# Patient Record
Sex: Male | Born: 1945 | Race: White | Hispanic: No | Marital: Married | Smoking: Never smoker
Health system: Southern US, Community
[De-identification: ages and names within clinical notes are randomized; demographics above are authoritative.]

## PROBLEM LIST (undated history)

## (undated) DIAGNOSIS — I10 Essential (primary) hypertension: Secondary | ICD-10-CM

## (undated) DIAGNOSIS — I2510 Atherosclerotic heart disease of native coronary artery without angina pectoris: Secondary | ICD-10-CM

## (undated) DIAGNOSIS — E7800 Pure hypercholesterolemia, unspecified: Secondary | ICD-10-CM

## (undated) DIAGNOSIS — J45909 Unspecified asthma, uncomplicated: Secondary | ICD-10-CM

## (undated) DIAGNOSIS — I480 Paroxysmal atrial fibrillation: Secondary | ICD-10-CM

## (undated) DIAGNOSIS — M419 Scoliosis, unspecified: Secondary | ICD-10-CM

## (undated) DIAGNOSIS — R7303 Prediabetes: Secondary | ICD-10-CM

## (undated) HISTORY — PX: KNEE SURGERY: SHX244

## (undated) HISTORY — PX: BACK SURGERY: SHX140

---

## 2003-04-20 ENCOUNTER — Ambulatory Visit (HOSPITAL_BASED_OUTPATIENT_CLINIC_OR_DEPARTMENT_OTHER): Admission: RE | Admit: 2003-04-20 | Discharge: 2003-04-20 | Payer: Self-pay | Admitting: Orthopedic Surgery

## 2005-01-23 ENCOUNTER — Inpatient Hospital Stay (HOSPITAL_COMMUNITY): Admission: RE | Admit: 2005-01-23 | Discharge: 2005-01-27 | Payer: Self-pay | Admitting: Orthopedic Surgery

## 2008-12-20 ENCOUNTER — Ambulatory Visit: Payer: Self-pay | Admitting: Diagnostic Radiology

## 2008-12-20 ENCOUNTER — Emergency Department (HOSPITAL_BASED_OUTPATIENT_CLINIC_OR_DEPARTMENT_OTHER): Admission: EM | Admit: 2008-12-20 | Discharge: 2008-12-20 | Payer: Self-pay | Admitting: Emergency Medicine

## 2010-12-10 LAB — COMPREHENSIVE METABOLIC PANEL
Albumin: 3.9 g/dL (ref 3.5–5.2)
BUN: 15 mg/dL (ref 6–23)
CO2: 30 mEq/L (ref 19–32)
Chloride: 105 mEq/L (ref 96–112)
Creatinine, Ser: 1 mg/dL (ref 0.4–1.5)
Glucose, Bld: 110 mg/dL — ABNORMAL HIGH (ref 70–99)
Potassium: 3.8 mEq/L (ref 3.5–5.1)
Sodium: 142 mEq/L (ref 135–145)
Total Protein: 7.4 g/dL (ref 6.0–8.3)

## 2010-12-10 LAB — URINALYSIS, ROUTINE W REFLEX MICROSCOPIC
Glucose, UA: NEGATIVE mg/dL
Hgb urine dipstick: NEGATIVE
Ketones, ur: 15 mg/dL — AB
Nitrite: NEGATIVE
Protein, ur: NEGATIVE mg/dL
pH: 7 (ref 5.0–8.0)

## 2010-12-10 LAB — DIFFERENTIAL
Eosinophils Absolute: 0.1 10*3/uL (ref 0.0–0.7)
Lymphocytes Relative: 5 % — ABNORMAL LOW (ref 12–46)
Monocytes Absolute: 0.6 10*3/uL (ref 0.1–1.0)
Monocytes Relative: 6 % (ref 3–12)
Neutro Abs: 9.1 10*3/uL — ABNORMAL HIGH (ref 1.7–7.7)
Neutrophils Relative %: 87 % — ABNORMAL HIGH (ref 43–77)

## 2010-12-10 LAB — CBC
MCHC: 34.3 g/dL (ref 30.0–36.0)
MCV: 92 fL (ref 78.0–100.0)

## 2011-01-16 NOTE — H&P (Signed)
NAME:  Mark Pham, Mark Pham NO.:  [id number]   MEDICAL RECORD NO.:  [id number]          PATIENT TYPE:  INP   LOCATION:  NA                           FACILITY:  [facility]   PHYSICIAN:  Dyke Brackett, M.D.    DATE OF BIRTH:  12/18/1945   DATE OF ADMISSION:  01/23/2005  DATE OF DISCHARGE:                                HISTORY & PHYSICAL   CHIEF COMPLAINT:  Left knee pain.   HISTORY OF PRESENT ILLNESS:  Mark Pham is a 65 year old gentleman who has  been having problems with his left knee for the last ten years.  He has been  having some pain and difficulty with range of motion and his knee giving  out.  He describes the pain as a sharp stabbing type pain along the medial  anterior and posterior aspect of the knee, just a constant sharp type pain.  He does note some swelling.  He does have some night pain.  He denies any  mechanical symptoms.  The patient has failed Synvisc therapy due to  significant reaction.  X-rays reveal near bone on bone left knee.   ALLERGIES:  ACE inhibitors causing exacerbation of asthma.   CURRENT MEDICATIONS:  Verapamil HCL 240 mg p.o. b.i.d., Probenecid 500 mg  p.o. daily, Naproxen 500 mg p.o. b.i.d. p.r.n., Colchicine 0.5 mg p.o.  p.r.n., AeroBid inhaler 250 mcg 2 puffs b.i.d.   PAST MEDICAL HISTORY:  1. Hypertension.  2. Asthma.  3. Gout.     PAST SURGICAL HISTORY:  1. Thoracoscopic exploratory for sarcoidosis.  2. Venous stripping, left leg.  3. Gallbladder removal.  4. Rotator cuff repair.  5. Knee arthroscopy x 2.  6. The patient denies any complications of asthma, surgical procedures, or      any infections.     SOCIAL HISTORY:  The patient is a healthy appearing, slightly heavy set  white male, no history of smoking or alcohol use, he is married, has several  children.  He lives in a one story house.  He is a retired Civil engineer, contracting.  His family physician is Dr. Synetta Fail and Marlyne Beards, P.A.-C.,  phone number  [phone].   FAMILY HISTORY:  Mother is deceased from a head injury.  Father deceased  from heart enlargement and cardiac disease.  One brother deceased from  complications of ETOH and cirrhosis.  One brother alive with a history of  diabetes and AIDS.  One sister alive with diabetes.   REVIEW OF SYMPTOMS:  Positive for recent post nasal drip with coughing.  The  patient denies any fevers, chills, congestion, difficulties with breathing,  or any other complaints.  He does have occasional shortness of breath with  exertion.  He relates to his weight and deconditioning.  He denies any  cardiac related symptoms.  The patient has been recently out of the country  in [place] returning in March of this year.  No particular medical  illnesses or problems while out of the country.  Otherwise, all other review  of systems categories are negative or noncontributory at this time.  PHYSICAL EXAMINATION:  VITAL SIGNS:  Height 5 feet 11 inches, weight 260 pounds, blood pressure  158/82, pulse 80 and regular, respirations 12, the patient is afebrile.  GENERAL:  This is a slightly centrally obese white male ambulates with a  slight left sided limp and he easily gets on and off the exam table without  any active distress.  HEENT:  Head was normocephalic.  Pupils equal, round, and reactive.  Extraocular movements intact.  Sclerae nonicteric.  External ears were  without deformities.  Gross hearing is intact.  Oral buccal mucosa was pink  and moist.  NECK:  Supple, no palpable lymphadenopathy, thyroid region was nontender.  He had good range of motion of the cervical spine without difficulty or  tenderness.  CHEST:  Lung sounds were clear and equal bilaterally, no wheezes, rales, or  rhonchi.  HEART:  Regular rate and rhythm, no murmurs, gallops, and rubs.  ABDOMEN:  Round, soft, nontender, bowel sounds normal active, CVA region was  nontender.  EXTREMITIES:  Upper extremities were symmetrically  sized and shaped.  He had  good range of motion of his shoulders, elbows, and wrist.  Motor strength  was 5/5.  Lower extremities:  Right and left hip had full extension, flexion  up to 120 degrees with 20 degrees internal and external rotation without any  difficulty.  The left knee had no signs of erythema or ecchymosis.  There  was some diffuse soft tissue swelling.  He had no effusion.  He was tender  along the medial and lateral joint line.  He had crepitus of the patella.  He had full extension, flexion to 100 degrees, and maybe 5 degrees of valgus  varus laxity.  The calves were soft and nontender.  The right knee was  without any signs of erythema or ecchymosis, no effusion, no diffuse  tenderness.  He had some slight crepitus on the patella.  He had full  extension, flexion back to 120 degrees, no instability, the calf was soft  and nontender.  The ankles were symmetrical with good dorsiplantar flexion.  PERIPHERAL VASCULATURE:  Carotid pulses were 2+, no bruits, radial pulses  were 2+, dorsalis pedes pulses were 2+.  He had no lower extremity edema.  He had multiple varicosities about the lower extremities.  NEUROLOGICAL:  The patient was conscious, alert and appropriate, easy  conversation with examiner.  Cranial nerves 2-12 were grossly intact.  He  had no gross neurologic defects noted.  BREASTS/RECTAL/GU:  Exams were deferred at this time.   IMPRESSION:  1. 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  2. Hypertension.  3. Asthma.  4. Gout.     PLAN:  The patient has been to his primary care physician for preoperative  evaluation with no complications noted.  The patient will undergo all other  routine labs and tests prior to having his left total knee arthroplasty by  Dr. Madelon Lips at [facility] on May 26.      RWK/MEDQ  D:  01/13/2005  T:  01/13/2005  Job:  [id number]

## 2011-01-16 NOTE — Discharge Summary (Signed)
NAME:  Mark Pham, Mark Pham NO.:  [id number]   MEDICAL RECORD NO.:  [id number]          PATIENT TYPE:  INP   LOCATION:  5014                         FACILITY:  [facility]   PHYSICIAN:  Dyke Brackett, M.D.    DATE OF BIRTH:  Feb 12, 1946   DATE OF ADMISSION:  01/23/2005  DATE OF DISCHARGE:  01/27/2005                                 DISCHARGE SUMMARY   ADMISSION DIAGNOSES:  1.  End-stage osteoarthritis, left knee.  2.  Hypertension.  3.  Asthma.  4.  Gout.   DISCHARGE DIAGNOSES:  1.  Status post left total knee arthroplasty.  2.  Acute blood loss anemia without symptoms.   HISTORY OF PRESENT ILLNESS:  Mark Pham is a 65 year old gentleman who has  had problems with his left knee for the past 10 years.  He has been having  some pain and difficulty with range of motion of the knee.  Mechanical  symptoms positive for giving way.  He describes the pain as a sharp,  stabbing type pain along the medial and anterior and posterior aspects of  the knee.  Positive weight pain.  He has failed conservative treatment which  included Synvisc injections.  He did have significant reaction to the  Synvisc.  X-rays of the left knee showed near bone on bone.   ALLERGIES:  ACE INHIBITORS cause exacerbation of asthma.   CURRENT MEDICATIONS:  1.  Verapamil/HCL 240 mg p.o. b.i.d.  2.  Naproxen 500 mg p.o. b.i.d. PRN.  3.  Colchicine 0.5 mg p.o. PRN.  4.  AeroBid inhaler 250 mcg two puffs b.i.d.  5.  Probenecid 500 mg p.o. daily.   SURGICAL PROCEDURE:  The patient was taken to the operating room on Jan 23, 2005 by Dr. Dyke Brackett, assisted by Rexene Edison, P.A., placed under general  anesthesia and a left total knee arthroplasty was performed.  The following  components were used:  An LCS large femur and patella, size 5 tibia with  12.5 mm bearing, all cemented.  The patient tolerated the procedure well and  was returned to the recovery room in good and stable condition.   CONSULTATIONS:   The following consults were obtained during the patient's  hospitalization:  1.  Physical therapy.  2.  Occupational therapy.  3.  Case management.   HOSPITAL COURSE:  On postoperative day #1 the patient developed nausea and  vomiting and therefore was changed to morphine, Dilaudid PCA.  Otherwise  patient was afebrile with vital signs stable.  H/H were 11.6 and 35.1.  White count was 14,400.  On postoperative day #2 the patient's nausea  improved.  The patient was placed on Urecholine for urinary retention.  PCA  was discontinued and Darvocet was started for pain.  There was some  difficulty removing the Hemovac from the knee, however, after the patient  worked with physical therapy the drain was pulled easily by Dr. Carola Frost.  X-  rays were performed and were to be reviewed by Dr. Madelon Lips on Monday.  On  postoperative day #3 the patient was afebrile and vital signs  were stable.  The patient underwent physical therapy and is progressing well.  On  postoperative day #4 the films were reviewed by Dr. Madelon Lips and __________  were, in fact, part of the tibial bearing to show orientation.  The patient  was afebrile with vital signs stable.  The wound was benign and therefore  the patient was discharged home in good condition.   LABORATORY DATA:  Routine labs on admission, CBC with all values within  normal limits.  Coag's with all values within normal limits.  Routine  chemistries with all values within normal limits.  Hepatic enzymes on  admission AST 41, ALT 43, ALP 89, total bilirubin 0.9.  Urinalysis on  admission was negative.   CLINICAL DATA:  X-rays, 2 views of the left knee performed on Jan 23, 2005  postoperative showed satisfactory total knee replacement without any  complications.  Two views of the left knee performed on Jan 26, 2005 showed  questionable residual stable marker piece of drain along the ____________ on  the lateral projection.  Abdominal x-ray performed on Jan 25, 2005 showed  mild gaseous distention of small and large bowel.  Stool was present  throughout the colon, no obstruction.  Findings may represent mild diffuse  ileus.   Electrocardiogram on Jan 19, 2005 showed sinus bradycardia with heart rate  55 beats per minute.  P-R interval 170 milliseconds. PRT axis 15, negative  ___________.   DISCHARGE INSTRUCTIONS:   DISCHARGE MEDICATIONS:  1.  Percocet 5/325 one to two every 4 to 6 hours as needed for pain.  2.  Lovenox 30 mg one shot q.12h. until ___________.  3.  Verapamil 240 mg one tablet b.i.d.  4.  Probenecid 500 mg daily.  5.  Colchicine 0.6 mg as needed for gout.  6.  AeroBid 2 puffs daily.  7.  Albuterol inhaler as needed.  8.  Mucinex as needed.  9.  Do not take Naproxen while on Lovenox.  10. Stool softener to be bought at the pharmacy.   DIET:  No restrictions.   ACTIVITY:  Weight-bearing as tolerated.   WOUND CARE:  The patient is to keep the wound clean and dry and change the  dressing daily.   FOLLOW UP:  Patient needs to follow up with Dr. Madelon Lips in the office on  Thursday following discharge.  Patient is to call the office at [phone].   SPECIAL INSTRUCTIONS:  Call with any increased pain, redness, drainage or  temperature greater than 101.  CPM 0 to 90% degrees 8 hours a day.   CONDITION ON DISCHARGE:  Patient was discharged home in good and stable  condition.      Richardean Canal, Arnetha Courser, M.D.  Electronically Signed    GC/MEDQ  D:  04/10/2005  T:  04/11/2005  Job:  [id number]

## 2011-01-16 NOTE — Op Note (Signed)
   NAME:  Mark, Pham NO.:  [id number]   MEDICAL RECORD NO.:  [id number]                   PATIENT TYPE:  AMB   LOCATION:  DSC                                  FACILITY:  [facility]   PHYSICIAN:  Thera Flake., M.D.             DATE OF BIRTH:  [birth year]   DATE OF PROCEDURE:  04/20/2003  DATE OF DISCHARGE:                                 OPERATIVE REPORT   INDICATIONS:  This is a 65 year old with previous arthroscopic surgery  several years ago, recurrent symptoms in the posteromedial joint line  consistent with degenerative change and meniscus tearing on MRI, thought to  be amenable to outpatient arthroscopy.   PREOPERATIVE DIAGNOSES:  1. Complex tear, posterior horn, medial meniscus.  2. 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  3. Grade 3 chondromalacia of femur   POSTOPERATIVE DIAGNOSES:  1. Complex tear, posterior horn, medial meniscus.  2. 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  3. Grade 3 chondromalacia of femur.   OPERATIONS:  1. Partial medial meniscectomy.  2. Microfracture of tibial plateau.   SURGEON:  Dyke Brackett, M.D.   ANESTHESIA:  General.   DESCRIPTION OF PROCEDURE:  Microscope through two portals, inferomedial and  inferolateral.  Patellofemoral joint and lateral compartment were  essentially normal.  The ACL and PCL normal.  Medial meniscus showed a  complex tear of the posterior horn of the meniscus requiring resection of  the posterior horn, probably 30% of the meniscus substance.  Underlying this  and in front of it and on each side, about 30% of the plateau had  unfortunately grade 4 changes of chondromalacia which were treated with a  microfracture technique with probably 15 holes being placed in the bone into  the subchondral bone.  The patient was drained free of fluids.  Portals  closed with nylon after injection of  Marcaine, morphine, and in addition 40  mg of  Depo-Medrol.   He was taken to the recovery room in stable condition.                                               Thera Flake., M.D.    WDC/MEDQ  D:  04/20/2003  T:  04/21/2003  Job:  [id number]

## 2011-01-16 NOTE — Op Note (Signed)
NAME:  Mark Pham, Mark Pham NO.:  [id number]   MEDICAL RECORD NO.:  [id number]          PATIENT TYPE:  INP   LOCATION:  2550                         FACILITY:  [facility]   PHYSICIAN:  Thera Flake., M.D.DATE OF BIRTH:  April 26, 1946   DATE OF PROCEDURE:  01/23/2005  DATE OF DISCHARGE:                                 OPERATIVE REPORT   PREOPERATIVE DIAGNOSIS:  Osteoarthritis of left knee with varus deformity.   POSTOPERATIVE DIAGNOSIS:  Osteoarthritis of left knee with varus deformity.   OPERATION:  Left total knee replacement (LCS large femur and patella, size 5  tibia with 12.5 mm bearing, cemented).   SURGEON:  Dyke Brackett, M.D.   ASSISTANTChestine Spore, P.A.   TOURNIQUET TIME:  1 hour 45 minutes.   DESCRIPTION OF PROCEDURE:  After preoperative femoral nerve block and  general anesthesia, the leg was exsanguinated and tourniquet inflated to 375  mmHg.  An incision medial parapatellar approach to the knee was made.  Moderate varus deformity was noted.  The tibia was cut about 2-3 mm below  the most diseased medial compartment, the most noticeable disease was  medially.  After the tibial cut, the anterior posterior femoral cut was made  and the flexion gap measured at 12.5 mm followed by distal 4 degree valgus  cut and the chamfer cut with keyholes for the prosthesis.  Flexion gap equal  to extension gap at 12.5 mm followed by distal 4 degree valgus cut and the  chamfer cuts with keyholes for the prosthesis.  Flexion gap equal to  extension gap at 12.5 mm.  Moderate stripping of the medial side of the knee  due to the varus deformity, it was tight particularly posteromedially, but  the ligamentous balance was deemed to be excellent at the conclusion of the  case.  Size 5 tibia was used with the keyhole being cut for the tibia.  There were no posterior osteophytes on the femur noted.  The patella was cut  leaving about 14 mm native patella, three peg patella.  Trial  reduction was  carried out with all components, fit deemed to be excellent, good stability.  It was thought with the 10 mm bearing, there was too much extension,  slightly too much drawer, and flexion, but the 12.5 mm bearing was excellent  in all planes tested.  Trial components were removed, the bony surfaces were  irrigated, final components were inserted, tibia, followed by femur and  patella.  The cement was allowed to harden, the tourniquet was released,  there was mild venous ooze from the incision.  The tourniquet was removed  from the leg, there was no excessive bleeding indicating significant injury  but thrombin spray was used due to some soft tissue ooze.  A Hemovac drain  was placed exiting superolaterally in the lateral gutter.  Closure was  effected with #1 Ethibond, 2-0 Vicryl, skin clips.  The Hemovac drain was  clamped.  He was taken to the recovery room in stable condition.    WDC/MEDQ  D:  01/23/2005  T:  01/23/2005  Job:  [id number] 

## 2013-01-02 ENCOUNTER — Emergency Department (HOSPITAL_BASED_OUTPATIENT_CLINIC_OR_DEPARTMENT_OTHER)
Admission: EM | Admit: 2013-01-02 | Discharge: 2013-01-02 | Disposition: A | Discharge status: Home / Self Care | Payer: Medicare Other | Attending: Emergency Medicine | Admitting: Emergency Medicine

## 2013-01-02 ENCOUNTER — Encounter (HOSPITAL_BASED_OUTPATIENT_CLINIC_OR_DEPARTMENT_OTHER): Payer: Self-pay | Admitting: *Deleted

## 2013-01-02 DIAGNOSIS — Y9389 Activity, other specified: Secondary | ICD-10-CM | POA: Insufficient documentation

## 2013-01-02 DIAGNOSIS — Z79899 Other long term (current) drug therapy: Secondary | ICD-10-CM | POA: Insufficient documentation

## 2013-01-02 DIAGNOSIS — I10 Essential (primary) hypertension: Secondary | ICD-10-CM | POA: Insufficient documentation

## 2013-01-02 DIAGNOSIS — Y929 Unspecified place or not applicable: Secondary | ICD-10-CM | POA: Insufficient documentation

## 2013-01-02 DIAGNOSIS — J45909 Unspecified asthma, uncomplicated: Secondary | ICD-10-CM | POA: Insufficient documentation

## 2013-01-02 DIAGNOSIS — S0180XA Unspecified open wound of other part of head, initial encounter: Secondary | ICD-10-CM | POA: Insufficient documentation

## 2013-01-02 DIAGNOSIS — S0993XA Unspecified injury of face, initial encounter: Secondary | ICD-10-CM | POA: Insufficient documentation

## 2013-01-02 DIAGNOSIS — E7800 Pure hypercholesterolemia, unspecified: Secondary | ICD-10-CM | POA: Insufficient documentation

## 2013-01-02 DIAGNOSIS — W208XXA Other cause of strike by thrown, projected or falling object, initial encounter: Secondary | ICD-10-CM | POA: Insufficient documentation

## 2013-01-02 DIAGNOSIS — S0181XA Laceration without foreign body of other part of head, initial encounter: Secondary | ICD-10-CM

## 2013-01-02 HISTORY — DX: Pure hypercholesterolemia, unspecified: E78.00

## 2013-01-02 HISTORY — DX: Essential (primary) hypertension: I10

## 2013-01-02 HISTORY — DX: Unspecified asthma, uncomplicated: J45.909

## 2013-01-02 NOTE — ED Notes (Signed)
Placing luggage in the attic and it fell onto his body hitting him in the face. Laceration to his right eyebrow. Bleeding controlled.

## 2013-01-02 NOTE — ED Provider Notes (Signed)
History     CSN: [id number]  Arrival date & time 01/02/13  2133   None     Chief Complaint  Patient presents with  . Facial Laceration    (Consider location/radiation/quality/duration/timing/severity/associated sxs/prior treatment) Patient is a 67 y.o. male presenting with facial injury. The history is provided by the patient. No language interpreter was used.  Facial Injury  The incident occurred just prior to arrival. The incident occurred at home. The injury mechanism was a direct blow. There is an injury to the face. The pain is moderate. It is unlikely that a foreign body is present. There have been prior injuries to these areas. His tetanus status is UTD. There were no sick contacts.  Pt reports a suitcase fell out of attic and hit him in the face  Past Medical History  Diagnosis Date  . Hypertension   . High cholesterol   . Asthma     History reviewed. No pertinent past surgical history.  No family history on file.  History  Substance Use Topics  . Smoking status: Never Smoker   . Smokeless tobacco: Not on file  . Alcohol Use: No      Review of Systems  All other systems reviewed and are negative.    Allergies  Ace inhibitors  Home Medications   Current Outpatient Rx  Name  Route  Sig  Dispense  Refill  . ALBUTEROL IN   Inhalation   Inhale into the lungs.         . Celecoxib (CELEBREX PO)   Oral   Take by mouth.         . Colesevelam HCl (WELCHOL PO)   Oral   Take by mouth.         Marland Kitchen HYDROCHLOROTHIAZIDE PO   Oral   Take by mouth.         . Montelukast Sodium (SINGULAIR PO)   Oral   Take by mouth.         . VERAPAMIL HCL PO   Oral   Take by mouth.           BP 171/95  Pulse 72  Temp(Src) 98 F (36.7 C) (Oral)  Resp 20  Wt 247 lb (112.038 kg)  SpO2 98%  Physical Exam  Nursing note and vitals reviewed. Constitutional: He is oriented to person, place, and time. He appears well-developed and well-nourished.  HENT:   Head: Normocephalic.  Right Ear: External ear normal.  Left Ear: External ear normal.  1cm laceration right eyebrow  Eyes: Conjunctivae and EOM are normal. Pupils are equal, round, and reactive to light.  Musculoskeletal: Normal range of motion.  Neurological: He is alert and oriented to person, place, and time. He has normal reflexes.  Skin: Skin is warm.  Psychiatric: He has a normal mood and affect.    ED Course  LACERATION REPAIR Date/Time: 01/02/2013 11:34 PM Performed by: Cheron Schaumann K Authorized by: Cheron Schaumann K Risks and benefits: risks, benefits and alternatives were discussed Consent given by: patient Required items: required blood products, implants, devices, and special equipment available Patient identity confirmed: verbally with patient Time out: Immediately prior to procedure a "time out" was called to verify the correct patient, procedure, equipment, support staff and site/side marked as required. Body area: head/neck Laceration length: 1 cm Foreign bodies: no foreign bodies Tendon involvement: none Nerve involvement: none Anesthesia: local infiltration Local anesthetic: lidocaine 2% with epinephrine Preparation: Patient was prepped and draped in the usual sterile fashion. Irrigation  solution: saline Debridement: none Skin closure: 6-0 Prolene Subcutaneous closure: 5-0 Vicryl Number of sutures: 3 Technique: simple Approximation: close Approximation difficulty: simple Patient tolerance: Patient tolerated the procedure well with no immediate complications.   (including critical care time)  Labs Reviewed - No data to display No results found.   1. Laceration of face without complication, initial encounter       MDM  Pt counseled on absorbable sutures.  (Pt is leaving for [place] and requested absorbable sutures.        Lonia Skinner [place], PA-C 01/02/13 2336

## 2013-01-02 NOTE — ED Notes (Addendum)
Pt reports suit case fell and struck him over R eye at eyebrow hair line bleeding controlled tetanus up to date

## 2013-01-04 NOTE — ED Provider Notes (Signed)
Medical screening examination/treatment/procedure(s) were performed by non-physician practitioner and as supervising physician I was immediately available for consultation/collaboration.   Brizeida Mcmurry W. Tonny Isensee, MD 01/04/13 0734 

## 2016-08-22 ENCOUNTER — Emergency Department (HOSPITAL_BASED_OUTPATIENT_CLINIC_OR_DEPARTMENT_OTHER)
Admission: EM | Admit: 2016-08-22 | Discharge: 2016-08-22 | Disposition: A | Discharge status: Home / Self Care | Payer: Medicare Other | Attending: Emergency Medicine | Admitting: Emergency Medicine

## 2016-08-22 ENCOUNTER — Encounter (HOSPITAL_BASED_OUTPATIENT_CLINIC_OR_DEPARTMENT_OTHER): Payer: Self-pay | Admitting: Emergency Medicine

## 2016-08-22 DIAGNOSIS — I10 Essential (primary) hypertension: Secondary | ICD-10-CM | POA: Diagnosis not present

## 2016-08-22 DIAGNOSIS — Z79899 Other long term (current) drug therapy: Secondary | ICD-10-CM | POA: Insufficient documentation

## 2016-08-22 DIAGNOSIS — M545 Low back pain, unspecified: Secondary | ICD-10-CM

## 2016-08-22 DIAGNOSIS — J45909 Unspecified asthma, uncomplicated: Secondary | ICD-10-CM | POA: Diagnosis not present

## 2016-08-22 MED ORDER — BUPIVACAINE HCL (PF) 0.5 % IJ SOLN
10.0000 mL | Freq: Once | INTRAMUSCULAR | Status: DC
  Filled 2016-08-22: qty 10

## 2016-08-22 MED ORDER — BUPIVACAINE HCL (PF) 0.5 % IJ SOLN
10.0000 mL | Freq: Once | INTRAMUSCULAR | Status: DC
Start: 2016-08-22 — End: 2016-08-23
  Filled 2016-08-22: qty 10

## 2016-08-22 NOTE — ED Triage Notes (Signed)
Patient reports that he had surgery on his back in June and then fell 2 weeks ago. PAtient saw his back specialist on the 21st. The patient reports that his left hip and down into his back and leg hurts worse

## 2016-08-22 NOTE — ED Provider Notes (Signed)
[facility] Provider Note   CSN: [id number] Arrival date & time: 08/22/16  1913  By signing my name below, I, Mark Pham, attest that this documentation has been prepared under the direction and in the presence of [name] [name], MD . Electronically Signed: Modena JanskyAlbert Pham, Scribe. 08/22/2016. 8:04 PM.  History   Chief Complaint Chief Complaint  Patient presents with  . Back Pain   The history is provided by the patient. No language interpreter was used.   HPI Comments: Mark Pham is a 70 y.o. male who presents to the Emergency Department complaining of constant moderate lower back pain that started about 4 days ago. He describes the pain as a spasm sensation exacerbated by movement. He reports associated symptoms of bilateral hip pain. He admits to a hx of back surgery. He denies any other complaints.   Past Medical History:  Diagnosis Date  . Asthma   . High cholesterol   . Hypertension     There are no active problems to display for this patient.   History reviewed. No pertinent surgical history.     Home Medications    Prior to Admission medications   Medication Sig Start Date End Date Taking? Authorizing Provider  diltiazem (CARDIZEM CD) 240 MG 24 hr capsule Take 240 mg by mouth daily.   Yes Historical Provider, MD  fenofibrate 160 MG tablet Take 160 mg by mouth daily.   Yes Historical Provider, MD  levothyroxine (SYNTHROID, LEVOTHROID) 50 MCG tablet Take 50 mcg by mouth daily before breakfast.   Yes Historical Provider, MD  losartan (COZAAR) 100 MG tablet Take 100 mg by mouth daily.   Yes Historical Provider, MD  terazosin (HYTRIN) 1 MG capsule Take 1 mg by mouth at bedtime.   Yes Historical Provider, MD  ALBUTEROL IN Inhale into the lungs.    Historical Provider, MD  Celecoxib (CELEBREX PO) Take by mouth.    Historical Provider, MD  Colesevelam HCl (WELCHOL PO) Take by mouth.    Historical Provider, MD  HYDROCHLOROTHIAZIDE PO Take by mouth.     Historical Provider, MD  Montelukast Sodium (SINGULAIR PO) Take by mouth.    Historical Provider, MD  VERAPAMIL HCL PO Take by mouth.    Historical Provider, MD    Family History History reviewed. No pertinent family history.  Social History Social History  Substance Use Topics  . Smoking status: Never Smoker  . Smokeless tobacco: Never Used  . Alcohol use No     Allergies   Ace inhibitors   Review of Systems Review of Systems  Musculoskeletal: Positive for back pain.  All other systems reviewed and are negative.  Physical Exam Updated Vital Signs Ht 5' 11.5" (1.816 m)   Wt 248 lb (112.5 kg)   BMI 34.11 kg/m   Physical Exam  Constitutional: He is oriented to person, place, and time. He appears well-developed and well-nourished. No distress.  HENT:  Head: Normocephalic and atraumatic.  Nose: Nose normal.  Eyes: Conjunctivae are normal.  Neck: Neck supple. No tracheal deviation present.  Cardiovascular: Normal rate and regular rhythm.   Pulmonary/Chest: Effort normal. No respiratory distress.  Abdominal: Soft. He exhibits no distension.  Musculoskeletal: He exhibits tenderness.  Left lumbar paraspinal TTP. Pain with hip flexion.   Neurological: He is alert and oriented to person, place, and time.  Skin: Skin is warm and dry.  Psychiatric: He has a normal mood and affect.  Nursing note and vitals reviewed.    ED Treatments / Results  DIAGNOSTIC STUDIES:   COORDINATION OF CARE: 8:08 PM- Pt advised of plan for treatment and pt agrees.  Labs (all labs ordered are listed, but only abnormal results are displayed) Labs Reviewed - No data to display  EKG  EKG Interpretation None       Radiology No results found.  Procedures Procedures (including critical care time)  Procedure Note: Trigger Point Injection for Myofascial pain  Performed by Dr. Clydene PughKnott Indication: muscle/myofascial pain Muscle body and tendon sheath of th leftt lumbar paraspinal and  superior gluteal muscle(s) were injected with 0.5% bupivacaine under sterile technique for release of muscle spasm/pain. Patient tolerated well with immediate improvement of symptoms and no immediate complications following procedure.  CPT Code:   1 or 2 muscle bodies: 20552   Medications Ordered in ED Medications  bupivacaine (MARCAINE) 0.5 % injection 10 mL (not administered)  bupivacaine (MARCAINE) 0.5 % injection 10 mL (not administered)     Initial Impression / Assessment and Plan / ED Course  I have reviewed the triage vital signs and the nursing notes.  Pertinent labs & imaging results that were available during my care of the patient were reviewed by me and considered in my medical decision making (see chart for details).  Clinical Course     70 y.o. male presents with left low back pain and buttock pain with pain of the left quadriceps. No midline pain or difficulty ambulating except for pain. Has evidence of spasm and palpable tenderness over left back musculature and superior gluteus. Trigger point injections x2 applied with improvement of symptoms. Patient was recommended to take short course of scheduled NSAIDs and engage in early mobility as definitive treatment. Plan to follow up with PCP as needed and return precautions discussed for worsening or new concerning symptoms.   Final Clinical Impressions(s) / ED Diagnoses   Final diagnoses:  Acute left-sided low back pain without sciatica    New Prescriptions New Prescriptions   No medications on file   I personally performed the services described in this documentation, which was scribed in my presence. The recorded information has been reviewed and is accurate.      [name] Presleigh Feldstein, MD 08/23/16 [phone]

## 2018-09-28 ENCOUNTER — Other Ambulatory Visit: Payer: Self-pay | Admitting: Orthopedic Surgery

## 2018-09-28 DIAGNOSIS — M4326 Fusion of spine, lumbar region: Secondary | ICD-10-CM

## 2018-10-06 ENCOUNTER — Ambulatory Visit
Admission: RE | Admit: 2018-10-06 | Discharge: 2018-10-06 | Disposition: A | Discharge status: Home / Self Care | Payer: Medicare Other | Source: Ambulatory Visit | Attending: Orthopedic Surgery | Admitting: Orthopedic Surgery

## 2018-10-06 DIAGNOSIS — M4326 Fusion of spine, lumbar region: Secondary | ICD-10-CM

## 2018-10-06 MED ORDER — DIAZEPAM 5 MG PO TABS
5.0000 mg | ORAL_TABLET | Freq: Once | ORAL | Status: AC
  Administered 2018-10-06: 5 mg via ORAL

## 2018-10-06 MED ORDER — IOPAMIDOL (ISOVUE-M 200) INJECTION 41%
15.0000 mL | Freq: Once | INTRAMUSCULAR | Status: AC
  Administered 2018-10-06: 15 mL via INTRATHECAL

## 2018-10-06 NOTE — Discharge Instructions (Signed)

## 2019-02-24 ENCOUNTER — Other Ambulatory Visit: Payer: Self-pay

## 2019-02-24 ENCOUNTER — Inpatient Hospital Stay (HOSPITAL_COMMUNITY)
Admission: EM | Admit: 2019-02-24 | Discharge: 2019-03-06 | DRG: 481 | Disposition: A | Discharge status: Skilled Nursing Facility | Payer: Medicare Other | Attending: Internal Medicine | Admitting: Internal Medicine

## 2019-02-24 ENCOUNTER — Emergency Department (HOSPITAL_COMMUNITY): Payer: Medicare Other

## 2019-02-24 ENCOUNTER — Inpatient Hospital Stay (HOSPITAL_COMMUNITY): Payer: Medicare Other

## 2019-02-24 ENCOUNTER — Encounter (HOSPITAL_COMMUNITY): Payer: Self-pay | Admitting: Emergency Medicine

## 2019-02-24 DIAGNOSIS — R61 Generalized hyperhidrosis: Secondary | ICD-10-CM | POA: Diagnosis not present

## 2019-02-24 DIAGNOSIS — Z6831 Body mass index (BMI) 31.0-31.9, adult: Secondary | ICD-10-CM

## 2019-02-24 DIAGNOSIS — N390 Urinary tract infection, site not specified: Secondary | ICD-10-CM | POA: Diagnosis present

## 2019-02-24 DIAGNOSIS — I2510 Atherosclerotic heart disease of native coronary artery without angina pectoris: Secondary | ICD-10-CM | POA: Diagnosis present

## 2019-02-24 DIAGNOSIS — E669 Obesity, unspecified: Secondary | ICD-10-CM | POA: Diagnosis present

## 2019-02-24 DIAGNOSIS — R0602 Shortness of breath: Secondary | ICD-10-CM

## 2019-02-24 DIAGNOSIS — J45909 Unspecified asthma, uncomplicated: Secondary | ICD-10-CM | POA: Diagnosis present

## 2019-02-24 DIAGNOSIS — I480 Paroxysmal atrial fibrillation: Secondary | ICD-10-CM | POA: Diagnosis not present

## 2019-02-24 DIAGNOSIS — T148XXA Other injury of unspecified body region, initial encounter: Secondary | ICD-10-CM

## 2019-02-24 DIAGNOSIS — J449 Chronic obstructive pulmonary disease, unspecified: Secondary | ICD-10-CM | POA: Diagnosis present

## 2019-02-24 DIAGNOSIS — Z7984 Long term (current) use of oral hypoglycemic drugs: Secondary | ICD-10-CM | POA: Diagnosis not present

## 2019-02-24 DIAGNOSIS — E119 Type 2 diabetes mellitus without complications: Secondary | ICD-10-CM | POA: Diagnosis present

## 2019-02-24 DIAGNOSIS — Z1639 Resistance to other specified antimicrobial drug: Secondary | ICD-10-CM | POA: Diagnosis present

## 2019-02-24 DIAGNOSIS — S72402A Unspecified fracture of lower end of left femur, initial encounter for closed fracture: Secondary | ICD-10-CM | POA: Diagnosis present

## 2019-02-24 DIAGNOSIS — I951 Orthostatic hypotension: Secondary | ICD-10-CM | POA: Diagnosis not present

## 2019-02-24 DIAGNOSIS — E2740 Unspecified adrenocortical insufficiency: Secondary | ICD-10-CM | POA: Diagnosis not present

## 2019-02-24 DIAGNOSIS — B964 Proteus (mirabilis) (morganii) as the cause of diseases classified elsewhere: Secondary | ICD-10-CM | POA: Diagnosis present

## 2019-02-24 DIAGNOSIS — Z1159 Encounter for screening for other viral diseases: Secondary | ICD-10-CM | POA: Diagnosis not present

## 2019-02-24 DIAGNOSIS — Z79890 Hormone replacement therapy (postmenopausal): Secondary | ICD-10-CM

## 2019-02-24 DIAGNOSIS — D62 Acute posthemorrhagic anemia: Secondary | ICD-10-CM | POA: Diagnosis present

## 2019-02-24 DIAGNOSIS — Z79899 Other long term (current) drug therapy: Secondary | ICD-10-CM

## 2019-02-24 DIAGNOSIS — I4821 Permanent atrial fibrillation: Secondary | ICD-10-CM | POA: Diagnosis present

## 2019-02-24 DIAGNOSIS — Z7951 Long term (current) use of inhaled steroids: Secondary | ICD-10-CM

## 2019-02-24 DIAGNOSIS — D638 Anemia in other chronic diseases classified elsewhere: Secondary | ICD-10-CM | POA: Diagnosis present

## 2019-02-24 DIAGNOSIS — E039 Hypothyroidism, unspecified: Secondary | ICD-10-CM | POA: Diagnosis present

## 2019-02-24 DIAGNOSIS — I9581 Postprocedural hypotension: Secondary | ICD-10-CM | POA: Diagnosis not present

## 2019-02-24 DIAGNOSIS — Z8249 Family history of ischemic heart disease and other diseases of the circulatory system: Secondary | ICD-10-CM

## 2019-02-24 DIAGNOSIS — M9712XA Periprosthetic fracture around internal prosthetic left knee joint, initial encounter: Principal | ICD-10-CM | POA: Diagnosis present

## 2019-02-24 DIAGNOSIS — W1830XA Fall on same level, unspecified, initial encounter: Secondary | ICD-10-CM | POA: Diagnosis present

## 2019-02-24 DIAGNOSIS — Z7982 Long term (current) use of aspirin: Secondary | ICD-10-CM

## 2019-02-24 DIAGNOSIS — R42 Dizziness and giddiness: Secondary | ICD-10-CM | POA: Diagnosis not present

## 2019-02-24 DIAGNOSIS — E7800 Pure hypercholesterolemia, unspecified: Secondary | ICD-10-CM | POA: Diagnosis present

## 2019-02-24 DIAGNOSIS — E785 Hyperlipidemia, unspecified: Secondary | ICD-10-CM | POA: Diagnosis present

## 2019-02-24 DIAGNOSIS — D649 Anemia, unspecified: Secondary | ICD-10-CM | POA: Diagnosis present

## 2019-02-24 DIAGNOSIS — S7292XA Unspecified fracture of left femur, initial encounter for closed fracture: Secondary | ICD-10-CM

## 2019-02-24 HISTORY — DX: Scoliosis, unspecified: M41.9

## 2019-02-24 HISTORY — DX: Paroxysmal atrial fibrillation: I48.0

## 2019-02-24 HISTORY — DX: Prediabetes: R73.03

## 2019-02-24 HISTORY — DX: Atherosclerotic heart disease of native coronary artery without angina pectoris: I25.10

## 2019-02-24 LAB — CBC
HCT: 39.6 % (ref 39.0–52.0)
Hemoglobin: 12.2 g/dL — ABNORMAL LOW (ref 13.0–17.0)
MCH: 28.8 pg (ref 26.0–34.0)
MCHC: 30.8 g/dL (ref 30.0–36.0)
MCV: 93.6 fL (ref 80.0–100.0)
Platelets: 165 10*3/uL (ref 150–400)
RBC: 4.23 MIL/uL (ref 4.22–5.81)
RDW: 14.6 % (ref 11.5–15.5)
WBC: 8.3 10*3/uL (ref 4.0–10.5)
nRBC: 0 % (ref 0.0–0.2)

## 2019-02-24 LAB — BASIC METABOLIC PANEL
Anion gap: 11 (ref 5–15)
BUN: 16 mg/dL (ref 8–23)
CO2: 22 mmol/L (ref 22–32)
Calcium: 8.8 mg/dL — ABNORMAL LOW (ref 8.9–10.3)
Chloride: 109 mmol/L (ref 98–111)
Creatinine, Ser: 0.87 mg/dL (ref 0.61–1.24)
GFR calc Af Amer: >60 mL/min (ref >60)
GFR calc non Af Amer: >60 mL/min (ref >60)
Glucose, Bld: 136 mg/dL — ABNORMAL HIGH (ref 70–99)
Potassium: 3.6 mmol/L (ref 3.5–5.1)
Sodium: 142 mmol/L (ref 135–145)

## 2019-02-24 LAB — SURGICAL PCR SCREEN
MRSA, PCR: NEGATIVE
Staphylococcus aureus: NEGATIVE

## 2019-02-24 LAB — SARS CORONAVIRUS 2 BY RT PCR (HOSPITAL ORDER, PERFORMED IN ~~LOC~~ HOSPITAL LAB): SARS Coronavirus 2: NEGATIVE

## 2019-02-24 MED ORDER — METHOCARBAMOL 1000 MG/10ML IJ SOLN
500.0000 mg | Freq: Four times a day (QID) | INTRAVENOUS | Status: DC | PRN
  Filled 2019-02-24: qty 5

## 2019-02-24 MED ORDER — MONTELUKAST SODIUM 10 MG PO TABS: 10.0000 mg | ORAL_TABLET | Freq: Every day | ORAL | Status: DC

## 2019-02-24 MED ORDER — LOSARTAN POTASSIUM 50 MG PO TABS
100.0000 mg | ORAL_TABLET | Freq: Every day | ORAL | Status: DC
  Administered 2019-02-26 – 2019-03-02 (×5): 100 mg via ORAL
  Filled 2019-02-24 (×5): qty 2

## 2019-02-24 MED ORDER — ONDANSETRON HCL 4 MG/2ML IJ SOLN
4.0000 mg | Freq: Four times a day (QID) | INTRAMUSCULAR | Status: DC | PRN
  Administered 2019-02-24: 4 mg via INTRAVENOUS

## 2019-02-24 MED ORDER — ASPIRIN 81 MG PO CHEW
81.0000 mg | CHEWABLE_TABLET | Freq: Every day | ORAL | Status: DC
  Administered 2019-02-26 – 2019-03-06 (×9): 81 mg via ORAL
  Filled 2019-02-24 (×9): qty 1

## 2019-02-24 MED ORDER — POVIDONE-IODINE 10 % EX SWAB: 2.0000 "application " | Freq: Once | CUTANEOUS | Status: DC

## 2019-02-24 MED ORDER — ENSURE PRE-SURGERY PO LIQD
296.0000 mL | Freq: Once | ORAL | Status: AC
  Administered 2019-02-24: 296 mL via ORAL
  Filled 2019-02-24: qty 296

## 2019-02-24 MED ORDER — GABAPENTIN 300 MG PO CAPS: 300.0000 mg | ORAL_CAPSULE | Freq: Two times a day (BID) | ORAL | Status: DC | PRN

## 2019-02-24 MED ORDER — ENOXAPARIN SODIUM 40 MG/0.4ML ~~LOC~~ SOLN
40.0000 mg | SUBCUTANEOUS | Status: DC
  Administered 2019-02-24 – 2019-03-05 (×10): 40 mg via SUBCUTANEOUS
  Filled 2019-02-24 (×11): qty 0.4

## 2019-02-24 MED ORDER — ALBUTEROL SULFATE (2.5 MG/3ML) 0.083% IN NEBU: 2.5000 mg | INHALATION_SOLUTION | Freq: Four times a day (QID) | RESPIRATORY_TRACT | Status: DC | PRN

## 2019-02-24 MED ORDER — METHOCARBAMOL 500 MG PO TABS
500.0000 mg | ORAL_TABLET | Freq: Four times a day (QID) | ORAL | Status: DC | PRN
  Administered 2019-02-25 – 2019-03-02 (×4): 500 mg via ORAL
  Filled 2019-02-24 (×4): qty 1

## 2019-02-24 MED ORDER — ONDANSETRON HCL 4 MG/2ML IJ SOLN
4.0000 mg | Freq: Once | INTRAMUSCULAR | Status: AC
  Administered 2019-02-24: 4 mg via INTRAVENOUS
  Filled 2019-02-24: qty 2

## 2019-02-24 MED ORDER — FERROUS SULFATE 325 (65 FE) MG PO TABS
325.0000 mg | ORAL_TABLET | Freq: Every day | ORAL | Status: DC
  Administered 2019-02-26 – 2019-03-06 (×9): 325 mg via ORAL
  Filled 2019-02-24 (×8): qty 1

## 2019-02-24 MED ORDER — COLESEVELAM HCL 625 MG PO TABS
1875.0000 mg | ORAL_TABLET | Freq: Two times a day (BID) | ORAL | Status: DC
  Administered 2019-02-24 – 2019-03-06 (×19): 1875 mg via ORAL
  Filled 2019-02-24 (×22): qty 3

## 2019-02-24 MED ORDER — MORPHINE SULFATE (PF) 4 MG/ML IV SOLN
4.0000 mg | Freq: Once | INTRAVENOUS | Status: AC
  Administered 2019-02-24: 4 mg via INTRAVENOUS
  Filled 2019-02-24: qty 1

## 2019-02-24 MED ORDER — ONDANSETRON HCL 4 MG/2ML IJ SOLN
INTRAMUSCULAR | Status: AC
  Filled 2019-02-24: qty 2

## 2019-02-24 MED ORDER — HYDRALAZINE HCL 20 MG/ML IJ SOLN
10.0000 mg | INTRAMUSCULAR | Status: DC | PRN
  Administered 2019-03-01: 10 mg via INTRAVENOUS
  Filled 2019-02-24: qty 1

## 2019-02-24 MED ORDER — CEFAZOLIN SODIUM-DEXTROSE 2-4 GM/100ML-% IV SOLN
2.0000 g | INTRAVENOUS | Status: AC
  Administered 2019-02-25: 2 g via INTRAVENOUS
  Filled 2019-02-24: qty 100

## 2019-02-24 MED ORDER — LEVOTHYROXINE SODIUM 75 MCG PO TABS
75.0000 ug | ORAL_TABLET | Freq: Every day | ORAL | Status: DC
  Administered 2019-02-26 – 2019-03-06 (×9): 75 ug via ORAL
  Filled 2019-02-24 (×9): qty 1

## 2019-02-24 MED ORDER — HYDROCODONE-ACETAMINOPHEN 5-325 MG PO TABS
1.0000 | ORAL_TABLET | Freq: Four times a day (QID) | ORAL | Status: DC | PRN
  Administered 2019-02-26 – 2019-03-02 (×9): 2 via ORAL
  Administered 2019-03-05: 19:00:00 1 via ORAL
  Filled 2019-02-24: qty 2
  Filled 2019-02-24: qty 1
  Filled 2019-02-24 (×3): qty 2
  Filled 2019-02-24: qty 1
  Filled 2019-02-24 (×6): qty 2

## 2019-02-24 MED ORDER — LEVOTHYROXINE SODIUM 50 MCG PO TABS: 50.0000 ug | ORAL_TABLET | Freq: Every day | ORAL | Status: DC

## 2019-02-24 MED ORDER — MOMETASONE FURO-FORMOTEROL FUM 200-5 MCG/ACT IN AERO
2.0000 | INHALATION_SPRAY | Freq: Two times a day (BID) | RESPIRATORY_TRACT | Status: DC
  Administered 2019-02-24 – 2019-03-06 (×19): 2 via RESPIRATORY_TRACT
  Filled 2019-02-24: qty 8.8

## 2019-02-24 MED ORDER — ATENOLOL 25 MG PO TABS
25.0000 mg | ORAL_TABLET | Freq: Every day | ORAL | Status: DC
  Administered 2019-02-25 – 2019-03-04 (×8): 25 mg via ORAL
  Filled 2019-02-24 (×8): qty 1

## 2019-02-24 MED ORDER — DILTIAZEM HCL ER COATED BEADS 120 MG PO CP24
240.0000 mg | ORAL_CAPSULE | Freq: Every day | ORAL | Status: DC
  Administered 2019-02-25 – 2019-03-02 (×6): 240 mg via ORAL
  Filled 2019-02-24 (×6): qty 2
  Filled 2019-02-24: qty 1

## 2019-02-24 MED ORDER — FEBUXOSTAT 40 MG PO TABS
40.0000 mg | ORAL_TABLET | Freq: Two times a day (BID) | ORAL | Status: DC
  Administered 2019-02-24 – 2019-03-06 (×19): 40 mg via ORAL
  Filled 2019-02-24 (×22): qty 1

## 2019-02-24 MED ORDER — MORPHINE SULFATE (PF) 2 MG/ML IV SOLN
0.5000 mg | INTRAVENOUS | Status: DC | PRN
  Administered 2019-02-24 – 2019-02-25 (×4): 0.5 mg via INTRAVENOUS
  Filled 2019-02-24 (×4): qty 1

## 2019-02-24 MED ORDER — CHLORHEXIDINE GLUCONATE 4 % EX LIQD
60.0000 mL | Freq: Once | CUTANEOUS | Status: AC
  Administered 2019-02-25: 4 via TOPICAL
  Filled 2019-02-24: qty 60

## 2019-02-24 NOTE — ED Triage Notes (Signed)
Pt arrives via EMS with reports of getting out of pool and fall. Pt fell into pool to avoid concrete. Deformity to left upper leg. Pulses present.  EMS gave 150 mcg fentanyl, 4 mg zofran

## 2019-02-24 NOTE — ED Provider Notes (Signed)
[facility]ENC[facility] Provider Note   CSN: [id number] Arrival date & time: 02/24/19  1327     History   Chief Complaint Chief Complaint  Patient presents with  . Fall    HPI Mark Pham is a 73 y.o. male.     HPI  patient presents to the emergency department with left injury that occurred just prior to arrival.  The patient states he was at the pool with his granddaughter and they were getting out of the pool and he states that he started to stumble and he felt like his left leg bent inward and he felt a sudden severe pain.  The patient states he started to fall but then instead of falling onto the ground he thrust himself back into the pool.  Patient states that did not fall or hit the ground.  Patient denies any other injuries. Past Medical History:  Diagnosis Date  . Asthma   . High cholesterol   . Hypertension     There are no active problems to display for this patient.   Past Surgical History:  Procedure Laterality Date  . KNEE SURGERY Left         Home Medications    Prior to Admission medications   Medication Sig Start Date End Date Taking? Authorizing Provider  albuterol (PROVENTIL HFA;VENTOLIN HFA) 108 (90 Base) MCG/ACT inhaler Inhale into the lungs every 6 (six) hours as needed for wheezing or shortness of breath.    [provider]  albuterol (PROVENTIL) (2.5 MG/3ML) 0.083% nebulizer solution 2.5 mg. 10/25/13   [provider]  aspirin 81 MG chewable tablet Chew 81 mg by mouth daily.    [provider]  atenolol (TENORMIN) 25 MG tablet Take 25 mg by mouth daily.    [provider]  budesonide-formoterol (SYMBICORT) 160-4.5 MCG/ACT inhaler Inhale into the lungs. 10/25/13   [provider]  Cholecalciferol (VITAMIN D3) 50 MCG (2000 UT) CHEW Chew by mouth.    [provider]  Colesevelam HCl (WELCHOL PO) Take 625 mg by mouth. Three in the morning and three at night     [provider]  diltiazem (CARDIZEM CD) 240 MG 24 hr capsule Take 240 mg by mouth daily.    [provider]  diltiazem (DILACOR XR) 240 MG 24 hr capsule Take 240 mg by mouth daily.    [provider]  febuxostat (ULORIC) 40 MG tablet Take 80 mg by mouth daily.    [provider]  gabapentin (NEURONTIN) 300 MG capsule Take 300 mg by mouth at bedtime.    [provider]  levothyroxine (SYNTHROID, LEVOTHROID) 50 MCG tablet Take 50 mcg by mouth daily before breakfast.    [provider]  losartan (COZAAR) 100 MG tablet Take 100 mg by mouth daily.    [provider]  metFORMIN (GLUCOPHAGE) 500 MG tablet Take 500 mg by mouth 2 (two) times daily with a meal.    [provider]  Montelukast Sodium (SINGULAIR PO) Take 10 mg by mouth at bedtime.     [provider]  sildenafil (VIAGRA) 50 MG tablet Take 25 mg by mouth daily as needed for erectile dysfunction.     [provider]  vitamin B-12 (CYANOCOBALAMIN) 1000 MCG tablet Take by mouth.    [provider]    Family History No family history on file.  Social History Social History   Tobacco Use  . Smoking status: Never Smoker  . Smokeless tobacco:  Never Used  Substance Use Topics  . Alcohol use: No  . Drug use: No     Allergies   Latanoprost, Ace inhibitors, Allopurinol, and Statins   Review of Systems Review of Systems  All other systems negative except as documented in the HPI. All pertinent positives and negatives as reviewed in the HPI. Physical Exam Updated Vital Signs BP (!) 170/96   Pulse (!) 53   Temp 97.7 F (36.5 C) (Oral)   Resp 13   Ht 5' 11.5" (1.816 m)   Wt 104.3 kg   SpO2 98%   BMI 31.63 kg/m   Physical Exam Vitals signs and nursing note reviewed.  Constitutional:      General: He is not in acute distress.    Appearance: He is well-developed.  HENT:     Head: Normocephalic and atraumatic.  Eyes:      Pupils: Pupils are equal, round, and reactive to light.  Neck:     Musculoskeletal: Normal range of motion and neck supple.  Cardiovascular:     Rate and Rhythm: Normal rate and regular rhythm.     Heart sounds: Normal heart sounds. No murmur. No friction rub. No gallop.   Pulmonary:     Effort: Pulmonary effort is normal. No respiratory distress.     Breath sounds: Normal breath sounds. No wheezing.  Musculoskeletal:       Legs:  Skin:    General: Skin is warm and dry.     Capillary Refill: Capillary refill takes less than 2 seconds.     Findings: No erythema or rash.  Neurological:     Mental Status: He is alert and oriented to person, place, and time.     Motor: No abnormal muscle tone.     Coordination: Coordination normal.  Psychiatric:        Behavior: Behavior normal.      ED Treatments / Results  Labs (all labs ordered are listed, but only abnormal results are displayed) Labs Reviewed  CBC - Abnormal; Notable for the following components:      Result Value   Hemoglobin 12.2 (*)    All other components within normal limits  BASIC METABOLIC PANEL - Abnormal; Notable for the following components:   Glucose, Bld 136 (*)    Calcium 8.8 (*)    All other components within normal limits  SARS CORONAVIRUS 2 (HOSPITAL ORDER, [place] [facility])    EKG    Radiology Dg Femur Portable 1 View Left  Result Date: 02/24/2019 CLINICAL DATA:  [place].  Left distal leg pain and swelling. EXAM: LEFT FEMUR PORTABLE 1 VIEW COMPARISON:  None. FINDINGS: There is a spiral type fracture of the distal femoral shaft at the middle third distal third junction region, well above the femoral prosthesis. There is approximately 1 cortex width of displacement in the single plane. IMPRESSION: Spiral type fracture of the distal femoral shaft. Electronically Signed   By: Marijo Sanes M.D.   On: 02/24/2019 15:00    Procedures Procedures (including critical care time)   Medications Ordered in ED Medications  ondansetron (ZOFRAN) injection 4 mg (4 mg Intravenous Given 02/24/19 1341)  morphine 4 MG/ML injection 4 mg (4 mg Intravenous Given 02/24/19 1357)     Initial Impression / Assessment and Plan / ED Course  I have reviewed the triage vital signs and the nursing notes.  Pertinent labs & imaging results that were available during my care of the patient were reviewed by me  and considered in my medical decision making (see chart for details).       Patient has a distal femur fracture.  Patient will be seen by Earney HamburgMichael Jeffrey, PA-C from orthopedics.  Final Clinical Impressions(s) / ED Diagnoses   Final diagnoses:  None    ED Discharge Orders    None       Charlestine NightLawyer, Naseem Adler, PA-C 02/24/19 1522    Pricilla LovelessGoldston, Scott, MD 02/28/19 [phone]

## 2019-02-24 NOTE — ED Notes (Signed)
ED TO INPATIENT HANDOFF REPORT  ED Nurse Name and Phone #: Burman Nieves [phone]  S Name/Age/Gender Mark Pham 73 y.o. male Room/Bed: TRACC/TRACC  Code Status   Code Status: Full Code  Home/SNF/Other Home Patient oriented to: self, place, time and situation Is this baseline? Yes   Triage Complete: Triage complete  Chief Complaint Fall; Femur Fx  Triage Note Pt arrives via EMS with reports of getting out of pool and fall. Pt fell into pool to avoid concrete. Deformity to left upper leg. Pulses present.  EMS gave 150 mcg fentanyl, 4 mg zofran   Allergies Allergies  Allergen Reactions  . Ace Inhibitors Shortness Of Breath and Cough  . Statins Shortness Of Breath and Other (See Comments)    Muscle pain, also   . Latanoprost Other (See Comments)    Headaches   . Allopurinol Itching and Rash    Level of Care/Admitting Diagnosis ED Disposition    ED Disposition Condition [name]  Level of Care: Telemetry Medical [104]  Covid Evaluation: Screening Protocol (No Symptoms)  Diagnosis: Closed fracture of left distal femur [facility]) [580998]  Admitting Physician: Mark Pham [[id number]]  Attending Physician: Mark Pham [[id number]]  Estimated length of stay: past midnight tomorrow  Certification:: I certify this patient will need inpatient services for at least 2 midnights  PT Class (Do Not Modify): Inpatient [101]  PT Acc Code (Do Not Modify): Private [1]       B Medical/Surgery History Past Medical History:  Diagnosis Date  . Asthma   . High cholesterol   . Hypertension   . Nonobstructive atherosclerosis of coronary artery   . PAF (paroxysmal atrial fibrillation) ([place])    s/p cryoablation 22015  . Scoliosis    Past Surgical History:  Procedure Laterality Date  . BACK SURGERY    . KNEE SURGERY Left      A IV Location/Drains/Wounds Patient Lines/Drains/Airways Status   Active  Line/Drains/Airways    Name:   Placement date:   Placement time:   Site:   Days:   Peripheral IV 02/24/19 Right Antecubital   02/24/19    1330    Antecubital   less than 1          Intake/Output Last 24 hours No intake or output data in the 24 hours ending 02/24/19 1802  Labs/Imaging Results for orders placed or performed during the hospital encounter of 02/24/19 (from the past 48 hour(s))  CBC     Status: Abnormal   Collection Time: 02/24/19  1:36 PM  Result Value Ref Range   WBC 8.3 4.0 - 10.5 K/uL   RBC 4.23 4.22 - 5.81 MIL/uL   Hemoglobin 12.2 (L) 13.0 - 17.0 g/dL   HCT 39.6 39.0 - 52.0 %   MCV 93.6 80.0 - 100.0 fL   MCH 28.8 26.0 - 34.0 pg   MCHC 30.8 30.0 - 36.0 g/dL   RDW 14.6 11.5 - 15.5 %   Platelets 165 150 - 400 K/uL   nRBC 0.0 0.0 - 0.2 %    Comment: Performed at [facility], [place] [address], [place], [place] [address]  Basic metabolic panel     Status: Abnormal   Collection Time: 02/24/19  1:36 PM  Result Value Ref Range   Sodium 142 135 - 145 mmol/L   Potassium 3.6 3.5 - 5.1 mmol/L   Chloride 109 98 - 111 mmol/L   CO2 22 22 -  32 mmol/L   Glucose, Bld 136 (H) 70 - 99 mg/dL   BUN 16 8 - 23 mg/dL   Creatinine, Ser 8.290.87 0.61 - 1.24 mg/dL   Calcium 8.8 (L) 8.9 - 10.3 mg/dL   GFR calc non Af Amer >60 >60 mL/min   GFR calc Af Amer >60 >60 mL/min   Anion gap 11 5 - 15    Comment: Performed at [facility], [address] [place], [place] [address]  SARS Coronavirus 2 (CEPHEID - Performed in [facility] hospital lab), Hosp Order     Status: None   Collection Time: 02/24/19  1:47 PM   Specimen: Nasopharyngeal Swab  Result Value Ref Range   SARS Coronavirus 2 NEGATIVE NEGATIVE    Comment: (NOTE) If result is NEGATIVE SARS-CoV-2 target nucleic acids are NOT DETECTED. The SARS-CoV-2 RNA is generally detectable in upper and lower  respiratory specimens during the acute phase of infection. The lowest  concentration of SARS-CoV-2 viral copies this assay  can detect is 250  copies / mL. A negative result does not preclude SARS-CoV-2 infection  and should not be used as the sole basis for treatment or other  patient management decisions.  A negative result may occur with  improper specimen collection / handling, submission of specimen other  than nasopharyngeal swab, presence of viral mutation(s) within the  areas targeted by this assay, and inadequate number of viral copies  (<250 copies / mL). A negative result must be combined with clinical  observations, patient history, and epidemiological information. If result is POSITIVE SARS-CoV-2 target nucleic acids are DETECTED. The SARS-CoV-2 RNA is generally detectable in upper and lower  respiratory specimens dur ing the acute phase of infection.  Positive  results are indicative of active infection with SARS-CoV-2.  Clinical  correlation with patient history and other diagnostic information is  necessary to determine patient infection status.  Positive results do  not rule out bacterial infection or co-infection with other viruses. If result is PRESUMPTIVE POSTIVE SARS-CoV-2 nucleic acids MAY BE PRESENT.   A presumptive positive result was obtained on the submitted specimen  and confirmed on repeat testing.  While 2019 novel coronavirus  (SARS-CoV-2) nucleic acids may be present in the submitted sample  additional confirmatory testing may be necessary for epidemiological  and / or clinical management purposes  to differentiate between  SARS-CoV-2 and other Sarbecovirus currently known to infect humans.  If clinically indicated additional testing with an alternate test  methodology [phone] is advised. The SARS-CoV-2 RNA is generally  detectable in upper and lower respiratory sp ecimens during the acute  phase of infection. The expected result is Negative. Fact Sheet for Patients:  BoilerBrush.com.cyhttps://www.fda.gov/media/136312/download Fact Sheet for Healthcare  Providers: https://pope.com/https://www.fda.gov/media/136313/download This test is not yet approved or cleared by the [place] FDA and has been authorized for detection and/or diagnosis of SARS-CoV-2 by FDA under an Emergency Use Authorization (EUA).  This EUA will remain in effect (meaning this test can be used) for the duration of the COVID-19 declaration under Section 564(b)(1) of the Act, 21 U.S.C. section 360bbb-3(b)(1), unless the authorization is terminated or revoked sooner. Performed at [facility], [address] [place], [place] [address]    Dg Femur Portable 1 View Left  Result Date: 02/24/2019 CLINICAL DATA:  [place].  Left distal leg pain and swelling. EXAM: LEFT FEMUR PORTABLE 1 VIEW COMPARISON:  None. FINDINGS: There is a spiral type fracture of the distal femoral shaft at the middle  third distal third junction region, well above the femoral prosthesis. There is approximately 1 cortex width of displacement in the single plane. IMPRESSION: Spiral type fracture of the distal femoral shaft. Electronically Signed   By: Rudie MeyerP.  Gallerani M.D.   On: 02/24/2019 15:00    Pending Labs Unresulted Labs (From admission, onward)    Start     Ordered   02/25/19 0500  Basic metabolic panel  Tomorrow morning,   R     02/24/19 1636   02/25/19 0500  CBC  Tomorrow morning,   R     02/24/19 1636   02/25/19 0500  Type and screen [facility]  Once,   STAT    Comments: [facility]    02/24/19 1636          Vitals/Pain Today's Vitals   02/24/19 1715 02/24/19 1730 02/24/19 1745 02/24/19 1800  BP: (!) 156/96 (!) 156/99 (!) 158/91 (!) 146/87  Pulse: (!) 54 61 (!) 58 (!) 58  Resp: 14 15 12 14   Temp:      TempSrc:      SpO2: 100% 93% 99% 98%  Weight:      Height:      PainSc:        Isolation Precautions No active isolations  Medications Medications  aspirin chewable tablet 81 mg (has no administration in time range)  mometasone-formoterol (DULERA)  200-5 MCG/ACT inhaler 2 puff (has no administration in time range)  montelukast (SINGULAIR) tablet 10 mg (has no administration in time range)  HYDROcodone-acetaminophen (NORCO/VICODIN) 5-325 MG per tablet 1-2 tablet (has no administration in time range)  morphine 2 MG/ML injection 0.5 mg (has no administration in time range)  enoxaparin (LOVENOX) injection 40 mg (has no administration in time range)  methocarbamol (ROBAXIN) tablet 500 mg (has no administration in time range)    Or  methocarbamol (ROBAXIN) 500 mg in dextrose 5 % 50 mL IVPB (has no administration in time range)  albuterol (PROVENTIL) (2.5 MG/3ML) 0.083% nebulizer solution 2.5 mg (has no administration in time range)  hydrALAZINE (APRESOLINE) injection 10 mg (has no administration in time range)  febuxostat (ULORIC) tablet 40 mg (has no administration in time range)  atenolol (TENORMIN) tablet 25 mg (has no administration in time range)  colesevelam [facility](WELCHOL) tablet 1,875 mg (has no administration in time range)  diltiazem (CARDIZEM CD) 24 hr capsule 240 mg (has no administration in time range)  levothyroxine (SYNTHROID) tablet 75 mcg (has no administration in time range)  losartan (COZAAR) tablet 100 mg (has no administration in time range)  gabapentin (NEURONTIN) capsule 300 mg (has no administration in time range)  ferrous sulfate tablet 325 mg (has no administration in time range)  ondansetron (ZOFRAN) injection 4 mg (4 mg Intravenous Given 02/24/19 1341)  morphine 4 MG/ML injection 4 mg (4 mg Intravenous Given 02/24/19 1357)  morphine 4 MG/ML injection 4 mg (4 mg Intravenous Given 02/24/19 1602)    Mobility walks High fall risk   Focused Assessments    R Recommendations: See Admitting Provider Note  Report given to:   Additional Notes:  [employer]

## 2019-02-24 NOTE — Progress Notes (Signed)
Pt arrived to room. Call bell within reach. Sister updated via telephone.

## 2019-02-24 NOTE — Consult Note (Signed)
Reason for Consult:Left femur fx Referring Physician: C Rodney LangtonBrown  Mark Pham is an 73 y.o. male.  HPI: Mark FallenKenneth was at the pool and stepped wrong on the concrete apron. He heard a pop in his leg/knee and began to fall. He was close to the edge so just tipped over into the pool instead of falling on the concrete. He had severe pain and could not walk. He came to the ED and x-rays showed a periprosthetic distal femur fx and orthopedic surgery was consulted.  Past Medical History:  Diagnosis Date  . Asthma   . High cholesterol   . Hypertension     Past Surgical History:  Procedure Laterality Date  . KNEE SURGERY Left     No family history on file.  Social History:  reports that he has never smoked. He has never used smokeless tobacco. He reports that he does not drink alcohol or use drugs.  Allergies:  Allergies  Allergen Reactions  . Latanoprost Other (See Comments)    Headaches  . Ace Inhibitors Cough  . Allopurinol Itching and Rash  . Statins Other (See Comments)    myalgias    Medications: I have reviewed the patient's current medications.  Results for orders placed or performed during the hospital encounter of 02/24/19 (from the past 48 hour(s))  CBC     Status: Abnormal   Collection Time: 02/24/19  1:36 PM  Result Value Ref Range   WBC 8.3 4.0 - 10.5 K/uL   RBC 4.23 4.22 - 5.81 MIL/uL   Hemoglobin 12.2 (L) 13.0 - 17.0 g/dL   HCT 16.139.6 09.639.0 - 04.552.0 %   MCV 93.6 80.0 - 100.0 fL   MCH 28.8 26.0 - 34.0 pg   MCHC 30.8 30.0 - 36.0 g/dL   RDW 40.914.6 81.111.5 - 91.415.5 %   Platelets 165 150 - 400 K/uL   nRBC 0.0 0.0 - 0.2 %    Comment: Performed at [facility], [address] [place], [place] [address]  Basic metabolic panel     Status: Abnormal   Collection Time: 02/24/19  1:36 PM  Result Value Ref Range   Sodium 142 135 - 145 mmol/L   Potassium 3.6 3.5 - 5.1 mmol/L   Chloride 109 98 - 111 mmol/L   CO2 22 22 - 32 mmol/L   Glucose, Bld 136 (H) 70 - 99 mg/dL   BUN 16 8  - 23 mg/dL   Creatinine, Ser 6.210.87 0.61 - 1.24 mg/dL   Calcium 8.8 (L) 8.9 - 10.3 mg/dL   GFR calc non Af Amer >60 >60 mL/min   GFR calc Af Amer >60 >60 mL/min   Anion gap 11 5 - 15    Comment: Performed at [facility], [address] [place], [place] [address]  SARS Coronavirus 2 (CEPHEID - Performed in [facility] hospital lab), Hosp Order     Status: None   Collection Time: 02/24/19  1:47 PM   Specimen: Nasopharyngeal Swab  Result Value Ref Range   SARS Coronavirus 2 NEGATIVE NEGATIVE    Comment: (NOTE) If result is NEGATIVE SARS-CoV-2 target nucleic acids are NOT DETECTED. The SARS-CoV-2 RNA is generally detectable in upper and lower  respiratory specimens during the acute phase of infection. The lowest  concentration of SARS-CoV-2 viral copies this assay can detect is 250  copies / mL. A negative result does not preclude SARS-CoV-2 infection  and should not be used as the sole basis for treatment or other  patient management decisions.  A negative result may occur with  improper specimen collection / handling, submission of specimen other  than nasopharyngeal swab, presence of viral mutation(s) within the  areas targeted by this assay, and inadequate number of viral copies  (<250 copies / mL). A negative result must be combined with clinical  observations, patient history, and epidemiological information. If result is POSITIVE SARS-CoV-2 target nucleic acids are DETECTED. The SARS-CoV-2 RNA is generally detectable in upper and lower  respiratory specimens dur ing the acute phase of infection.  Positive  results are indicative of active infection with SARS-CoV-2.  Clinical  correlation with patient history and other diagnostic information is  necessary to determine patient infection status.  Positive results do  not rule out bacterial infection or co-infection with other viruses. If result is PRESUMPTIVE POSTIVE SARS-CoV-2 nucleic acids MAY BE PRESENT.   A presumptive  positive result was obtained on the submitted specimen  and confirmed on repeat testing.  While 2019 novel coronavirus  (SARS-CoV-2) nucleic acids may be present in the submitted sample  additional confirmatory testing may be necessary for epidemiological  and / or clinical management purposes  to differentiate between  SARS-CoV-2 and other Sarbecovirus currently known to infect humans.  If clinically indicated additional testing with an alternate test  methodology [phone]) is advised. The SARS-CoV-2 RNA is generally  detectable in upper and lower respiratory sp ecimens during the acute  phase of infection. The expected result is Negative. Fact Sheet for Patients:  StrictlyIdeas.no Fact Sheet for Healthcare Providers: BankingDealers.co.za This test is not yet approved or cleared by the [place] FDA and has been authorized for detection and/or diagnosis of SARS-CoV-2 by FDA under an Emergency Use Authorization (EUA).  This EUA will remain in effect (meaning this test can be used) for the duration of the COVID-19 declaration under Section 564(b)(1) of the Act, 21 U.S.C. section 360bbb-3(b)(1), unless the authorization is terminated or revoked sooner. Performed at [facility], [place] [address], [place], [place] [address]     Dg Femur Portable 1 View Left  Result Date: 02/24/2019 CLINICAL DATA:  [place].  Left distal leg pain and swelling. EXAM: LEFT FEMUR PORTABLE 1 VIEW COMPARISON:  None. FINDINGS: There is a spiral type fracture of the distal femoral shaft at the middle third distal third junction region, well above the femoral prosthesis. There is approximately 1 cortex width of displacement in the single plane. IMPRESSION: Spiral type fracture of the distal femoral shaft. Electronically Signed   By: Marijo Sanes M.D.   On: 02/24/2019 15:00    Review of Systems  Constitutional: Negative for weight loss.  HENT: Negative for ear  discharge, ear pain, hearing loss and tinnitus.   Eyes: Negative for blurred vision, double vision, photophobia and pain.  Respiratory: Negative for cough, sputum production and shortness of breath.   Cardiovascular: Negative for chest pain.  Gastrointestinal: Negative for abdominal pain, nausea and vomiting.  Genitourinary: Negative for dysuria, flank pain, frequency and urgency.  Musculoskeletal: Positive for joint pain (Left thigh/knee). Negative for back pain, falls, myalgias and neck pain.  Neurological: Negative for dizziness, tingling, sensory change, focal weakness, loss of consciousness and headaches.  Endo/Heme/Allergies: Does not bruise/bleed easily.  Psychiatric/Behavioral: Negative for depression, memory loss and substance abuse. The patient is not nervous/anxious.    Blood pressure (!) 155/80, pulse (!) 55, temperature 97.7 F (36.5 C), temperature source Oral, resp. rate 12, height 5' 11.5" (1.816 m), weight 104.3 kg, SpO2 99 %.  Physical Exam  Constitutional: He appears well-developed and well-nourished. No distress.  HENT:  Head: Normocephalic and atraumatic.  Eyes: Conjunctivae are normal. Right eye exhibits no discharge. Left eye exhibits no discharge. No scleral icterus.  Neck: Normal range of motion.  Cardiovascular: Normal rate and regular rhythm.  Respiratory: Effort normal. No respiratory distress.  Musculoskeletal:     Comments: LLE No traumatic wounds, ecchymosis, or rash  Severe TTP knee and distal thigh, Hair traction in place  No knee or ankle effusion  Sens DPN, SPN, TN intact  Motor EHL, ext, flex, evers 5/5  DP 2+, PT 2+, No significant edema  Neurological: He is alert.  Skin: Skin is warm and dry. He is not diaphoretic.  Psychiatric: He has a normal mood and affect. His behavior is normal.    Assessment/Plan: Left femur fx -- Plan [place]ucks traction tonight. Plan for retrograde IMN tomorrow morning by Dr. Susa SimmondsAdair. NPO after MN. Multiple medical problems  including asthma, HLD, HTN, and s/p cardiac ablation -- IM to admit, appreciate their help.    Freeman CaldronMichael J. Lang Zingg, PA-C Orthopedic Surgery [phone] 02/24/2019, 4:02 PM

## 2019-02-24 NOTE — Plan of Care (Signed)
  Problem: Pain Managment: Goal: General experience of comfort will improve Outcome: Progressing   

## 2019-02-24 NOTE — Progress Notes (Signed)
Orthopedic Tech Progress Note Patient Details:  Mark Pham [birth year] [id number] APPLIED TRACTIONS WITH THE RN AT BEDSIDE! Musculoskeletal Traction Type of Traction: Bucks Skin Traction Traction Location: LLE Traction Weight: 10 lbs   Post Interventions Patient Tolerated: Well Instructions Provided: Care of device, Adjustment of device   Melony Overly T 02/24/2019, 6:55 PM

## 2019-02-24 NOTE — H&P (Addendum)
History and Physical    Mark Pham UJW:[id number] DOB: Nov 30, 1945 DOA: 02/24/2019  Referring MD/NP/PA: Charlestine Nighthristopher Lawyer, PA-C PCP: Bailey MechPodraza, Cole Christopher, PA-C  Patient coming from: Friend's house via EMS  Chief Complaint: Left leg pain  I have personally briefly reviewed patient's old medical records in [facility]   HPI: Mark LoanKenneth Pham is a 73 y.o. male with medical history significant of HTN, PAF s/p cryoablation 10/2013 on aspirin, HLD, CAD (nonobstructive by cath in 2012), asthma, and hypothyroidism; who presents with complaints of left leg pain.  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  He reports that knee went outward and he heard a pop.  He fell back into the pool to avoid falling on the concrete.  Denies any loss of consciousness or trauma to his head.  He complained of having severe pain and saw the deformity in his leg.  Denies any break in the skin, but it is on the same side that he has had previous knee replacement.  It is symptoms include a burning tingling sensation down the left leg.    In route with EMS patient was given 150 mcg of fentanyl and 4 mg of Zofran.  ED Course: Upon admission into the emergency department patient was noted to be afebrile, pulse 53-58, blood pressure elevated up to 185/86, and all other vital signs maintained.  Labs revealed WBC 8.3 and hemoglobin 12.2.  X-rays of left femur revealed a spiral type distal fracture.  COVID-19 screening negative.  Orthopedics consulted and plan on likely surgical procedure in a.m.  Review of Systems  Constitutional: Negative for chills and fever.  HENT: Negative for congestion and nosebleeds.   Eyes: Negative for photophobia, pain and discharge.  Respiratory: Negative for cough and shortness of breath.   Cardiovascular: Negative for chest pain and leg swelling.  Gastrointestinal: Negative for abdominal pain, nausea and vomiting.   Genitourinary: Negative for dysuria and frequency.  Musculoskeletal: Positive for falls, joint pain and myalgias.  Skin: Negative for itching.  Neurological: Positive for tingling. Negative for loss of consciousness.    Past Medical History:  Diagnosis Date  . Asthma   . High cholesterol   . Hypertension   . Nonobstructive atherosclerosis of coronary artery   . PAF (paroxysmal atrial fibrillation) (HCC)    s/p cryoablation 22015    Past Surgical History:  Procedure Laterality Date  . BACK SURGERY    . KNEE SURGERY Left      reports that he has never smoked. He has never used smokeless tobacco. He reports that he does not drink alcohol or use drugs.  Allergies  Allergen Reactions  . Latanoprost Other (See Comments)    Headaches  . Ace Inhibitors Cough  . Allopurinol Itching and Rash  . Statins Other (See Comments)    myalgias    Family History  Problem Relation Age of Onset  . Syncope episode Mother        Reportedly fainted hitting her head and thought subsequently later  . Cardiomyopathy Father     Prior to Admission medications   Medication Sig Start Date End Date Taking? Authorizing Provider  albuterol (PROVENTIL HFA;VENTOLIN HFA) 108 (90 Base) MCG/ACT inhaler Inhale into the lungs every 6 (six) hours as needed for wheezing or shortness of breath.    [provider]  albuterol (PROVENTIL) (2.5 MG/3ML) 0.083% nebulizer solution 2.5 mg. 10/25/13   [provider]  aspirin  81 MG chewable tablet Chew 81 mg by mouth daily.    [provider]  atenolol (TENORMIN) 25 MG tablet Take 25 mg by mouth daily.    [provider]  budesonide-formoterol (SYMBICORT) 160-4.5 MCG/ACT inhaler Inhale into the lungs. 10/25/13   [provider]  Cholecalciferol (VITAMIN D3) 50 MCG (2000 UT) CHEW Chew by mouth.    [provider]  Colesevelam HCl (WELCHOL PO) Take 625 mg by mouth. Three in the morning and three at night    [provider]  diltiazem (CARDIZEM CD) 240 MG 24 hr capsule Take 240 mg by mouth daily.    [provider]  diltiazem (DILACOR XR) 240 MG 24 hr capsule Take 240 mg by mouth daily.    [provider]  febuxostat (ULORIC) 40 MG tablet Take 80 mg by mouth daily.    [provider]  gabapentin (NEURONTIN) 300 MG capsule Take 300 mg by mouth at bedtime.    [provider]  levothyroxine (SYNTHROID, LEVOTHROID) 50 MCG tablet Take 50 mcg by mouth daily before breakfast.    [provider]  losartan (COZAAR) 100 MG tablet Take 100 mg by mouth daily.    [provider]  metFORMIN (GLUCOPHAGE) 500 MG tablet Take 500 mg by mouth 2 (two) times daily with a meal.    [provider]  Montelukast Sodium (SINGULAIR PO) Take 10 mg by mouth at bedtime.     [provider]  sildenafil (VIAGRA) 50 MG tablet Take 25 mg by mouth daily as needed for erectile dysfunction.     [provider]  vitamin B-12 (CYANOCOBALAMIN) 1000 MCG tablet Take by mouth.    [provider]    Physical Exam:  Constitutional: Elderly male in NAD, calm, comfortable Vitals:   02/24/19 1500 02/24/19 1530 02/24/19 1545 02/24/19 1600  BP: (!) 161/92 (!) 155/80 (!) 155/99 (!) 158/98  Pulse: (!) 56 (!) 55 (!) 58 (!) 57  Resp: 14 12 12 14   Temp:      TempSrc:      SpO2: 98% 99% 100% 99%  Weight:      Height:       Eyes: PERRL, lids and conjunctivae normal ENMT: Mucous membranes are moist. Posterior pharynx clear of any exudate or lesions. .  Neck: normal, supple, no masses, no thyromegaly Respiratory: clear to auscultation bilaterally, no wheezing, no crackles. Normal respiratory effort. No accessory muscle use.  Currently on 2 L nasal cannula oxygen never noted to be hypoxic. Cardiovascular: Bradycardic, no murmurs / rubs / gallops. No extremity edema. 2+ pedal pulses. No carotid bruits.  Abdomen: no tenderness, no masses palpated. No  hepatosplenomegaly. Bowel sounds positive.  Musculoskeletal: no clubbing / cyanosis.  Left leg in immobilizer, but visibly shortened Skin: no rashes, lesions, ulcers. No induration Neurologic: CN 2-12 grossly intact. Sensation intact, DTR normal. Strength 5/5 in all 4.  Psychiatric: Normal judgment and insight. Alert and oriented x 3. Normal mood.     Labs on Admission: I have personally reviewed following labs and imaging studies  CBC: Recent Labs  Lab 02/24/19 1336  WBC 8.3  HGB 12.2*  HCT 39.6  MCV 93.6  PLT 165   Basic Metabolic Panel: Recent Labs  Lab 02/24/19 1336  NA 142  K 3.6  CL 109  CO2 22  GLUCOSE 136*  BUN 16  CREATININE 0.87  CALCIUM 8.8*   GFR: Estimated Creatinine Clearance: 93.7 mL/min (by C-G formula based on SCr of  0.87 mg/dL). Liver Function Tests: No results for input(s): AST, ALT, ALKPHOS, BILITOT, PROT, ALBUMIN in the last 168 hours. No results for input(s): LIPASE, AMYLASE in the last 168 hours. No results for input(s): AMMONIA in the last 168 hours. Coagulation Profile: No results for input(s): INR, PROTIME in the last 168 hours. Cardiac Enzymes: No results for input(s): CKTOTAL, CKMB, CKMBINDEX, TROPONINI in the last 168 hours. BNP (last 3 results) No results for input(s): PROBNP in the last 8760 hours. HbA1C: No results for input(s): HGBA1C in the last 72 hours. CBG: No results for input(s): GLUCAP in the last 168 hours. Lipid Profile: No results for input(s): CHOL, HDL, LDLCALC, TRIG, CHOLHDL, LDLDIRECT in the last 72 hours. Thyroid Function Tests: No results for input(s): TSH, T4TOTAL, FREET4, T3FREE, THYROIDAB in the last 72 hours. Anemia Panel: No results for input(s): VITAMINB12, FOLATE, FERRITIN, TIBC, IRON, RETICCTPCT in the last 72 hours. Urine analysis:    Component Value Date/Time   COLORURINE YELLOW 12/20/2008 2012   APPEARANCEUR CLEAR 12/20/2008 2012   LABSPEC 1.024 12/20/2008 2012   PHURINE 7.0 12/20/2008 2012    GLUCOSEU NEGATIVE 12/20/2008 2012   HGBUR NEGATIVE 12/20/2008 2012   BILIRUBINUR NEGATIVE 12/20/2008 2012   KETONESUR 15 (A) 12/20/2008 2012   PROTEINUR NEGATIVE 12/20/2008 2012   UROBILINOGEN 1.0 12/20/2008 2012   NITRITE NEGATIVE 12/20/2008 2012   LEUKOCYTESUR  12/20/2008 2012    NEGATIVE MICROSCOPIC NOT DONE ON URINES WITH NEGATIVE PROTEIN, BLOOD, LEUKOCYTES, NITRITE, OR GLUCOSE <1000 mg/dL.   Sepsis Labs: Recent Results (from the past 240 hour(s))  SARS Coronavirus 2 (CEPHEID - Performed in [facility] hospital lab), Hosp Order     Status: None   Collection Time: 02/24/19  1:47 PM   Specimen: Nasopharyngeal Swab  Result Value Ref Range Status   SARS Coronavirus 2 NEGATIVE NEGATIVE Final    Comment: (NOTE) If result is NEGATIVE SARS-CoV-2 target nucleic acids are NOT DETECTED. The SARS-CoV-2 RNA is generally detectable in upper and lower  respiratory specimens during the acute phase of infection. The lowest  concentration of SARS-CoV-2 viral copies this assay can detect is 250  copies / mL. A negative result does not preclude SARS-CoV-2 infection  and should not be used as the sole basis for treatment or other  patient management decisions.  A negative result may occur with  improper specimen collection / handling, submission of specimen other  than nasopharyngeal swab, presence of viral mutation(s) within the  areas targeted by this assay, and inadequate number of viral copies  (<250 copies / mL). A negative result must be combined with clinical  observations, patient history, and epidemiological information. If result is POSITIVE SARS-CoV-2 target nucleic acids are DETECTED. The SARS-CoV-2 RNA is generally detectable in upper and lower  respiratory specimens dur ing the acute phase of infection.  Positive  results are indicative of active infection with SARS-CoV-2.  Clinical  correlation with patient history and other diagnostic information is  necessary to determine  patient infection status.  Positive results do  not rule out bacterial infection or co-infection with other viruses. If result is PRESUMPTIVE POSTIVE SARS-CoV-2 nucleic acids MAY BE PRESENT.   A presumptive positive result was obtained on the submitted specimen  and confirmed on repeat testing.  While 2019 novel coronavirus  (SARS-CoV-2) nucleic acids may be present in the submitted sample  additional confirmatory testing may be necessary for epidemiological  and / or clinical management purposes  to differentiate between  SARS-CoV-2 and other Sarbecovirus currently  known to infect humans.  If clinically indicated additional testing with an alternate test  methodology [phone]) is advised. The SARS-CoV-2 RNA is generally  detectable in upper and lower respiratory sp ecimens during the acute  phase of infection. The expected result is Negative. Fact Sheet for Patients:  StrictlyIdeas.no Fact Sheet for Healthcare Providers: BankingDealers.co.za This test is not yet approved or cleared by the [place] FDA and has been authorized for detection and/or diagnosis of SARS-CoV-2 by FDA under an Emergency Use Authorization (EUA).  This EUA will remain in effect (meaning this test can be used) for the duration of the COVID-19 declaration under Section 564(b)(1) of the Act, 21 U.S.C. section 360bbb-3(b)(1), unless the authorization is terminated or revoked sooner. Performed at [facility], [place] [address] [place], [place] [address]      Radiological Exams on Admission: Dg Femur Portable 1 View Left  Result Date: 02/24/2019 CLINICAL DATA:  [place].  Left distal leg pain and swelling. EXAM: LEFT FEMUR PORTABLE 1 VIEW COMPARISON:  None. FINDINGS: There is a spiral type fracture of the distal femoral shaft at the middle third distal third junction region, well above the femoral prosthesis. There is approximately 1 cortex width of displacement  in the single plane. IMPRESSION: Spiral type fracture of the distal femoral shaft. Electronically Signed   By: Marijo Sanes M.D.   On: 02/24/2019 15:00    X-rays of the left femur: Independently reviewed.  Revealing distal femur fracture  Assessment/Plan Distal femur fracture: Acute.  Patient reports getting out of the pool when his leg went away and he heard a pop.  X-rays reveal a left distal spiralfemur fracture.  Orthopedics consulted and plan on doing surgery in a.m. -Admit to medical telemetry bed -Hip fracture order set utilized -Hydrocodone/morphine as needed for pain -Social work consulted -Appreciate orthopedic consultative services, will follow-up for any further recommendations  Normocytic anemia: Hemoglobin 12.2 g/dL.  No reports of bleeding. -Continue to monitor  Essential hypertension: Blood pressures elevated up to 185/86.  Suspect gait elevation related with pain. -Continue atenolol, diltiazem, losartan -Hydralazine IV as needed   Paroxysmal atrial fibrillation: Patient with previous history of atrial fibrillation back in 10/2013.  Patient reports being on anticoagulation for a 1-2 weeks prior to undergoing cryoablation.  Since that time he has been on a daily aspirin and followed by cardiology.  Currently in sinus rhythm. -Continue aspirin and diltiazem  Coronary artery disease: Noted to be nonobstructive by cardiac cath in 2012. -Continue aspirin  Hypothyroidism: TSH 3.324 on 12/30/2018. -Continue levothyroxine  History of asthma: Patient without complaints of shortness of breath for all. -Check chest x-ray -Pharmacy substitution of Dulera -Albuterol nebs as needed for shortness of breath/wheezing  Diabetes mellitus type 2: Patient appears well controlled on metformin.  Last hemoglobin A1c 5.8 on 12/30/2018.  -Hold metformin -Heart healthy and carb modified diet -Continue gabapentin as needed for neuropathy  Obesity: BMI 31.63 kg/m -Continue to counsel on need  of weight loss  DVT prophylaxis: lovenox  Code Status: Full Family Communication: Patient reports that he is able to update his wife Disposition Plan: Will likely need rehab in 2 to 3 days pending surgery Consults called: Orthopedics Admission status: Inpatient  Norval Morton MD [facility] Pager [phone]   If 7PM-7AM, please contact night-coverage [email or web address] Password [facility]  02/24/2019, 4:27 PM

## 2019-02-25 ENCOUNTER — Inpatient Hospital Stay (HOSPITAL_COMMUNITY): Payer: Medicare Other

## 2019-02-25 ENCOUNTER — Encounter (HOSPITAL_COMMUNITY)
Admission: EM | Disposition: A | Discharge status: Skilled Nursing Facility | Payer: Self-pay | Source: Home / Self Care | Attending: Internal Medicine

## 2019-02-25 ENCOUNTER — Inpatient Hospital Stay (HOSPITAL_COMMUNITY): Payer: Medicare Other | Admitting: Certified Registered Nurse Anesthetist

## 2019-02-25 HISTORY — PX: FEMUR IM NAIL: SHX1597

## 2019-02-25 LAB — CBC
HCT: 35.4 % — ABNORMAL LOW (ref 39.0–52.0)
Hemoglobin: 11 g/dL — ABNORMAL LOW (ref 13.0–17.0)
MCH: 28.9 pg (ref 26.0–34.0)
MCHC: 31.1 g/dL (ref 30.0–36.0)
MCV: 92.9 fL (ref 80.0–100.0)
Platelets: 149 10*3/uL — ABNORMAL LOW (ref 150–400)
RBC: 3.81 MIL/uL — ABNORMAL LOW (ref 4.22–5.81)
RDW: 15 % (ref 11.5–15.5)
WBC: 7 10*3/uL (ref 4.0–10.5)
nRBC: 0 % (ref 0.0–0.2)

## 2019-02-25 LAB — GLUCOSE, CAPILLARY: Glucose-Capillary: 109 mg/dL — ABNORMAL HIGH (ref 70–99)

## 2019-02-25 LAB — TYPE AND SCREEN
ABO/RH(D): A POS
Antibody Screen: NEGATIVE

## 2019-02-25 LAB — BASIC METABOLIC PANEL
Anion gap: 8 (ref 5–15)
BUN: 14 mg/dL (ref 8–23)
CO2: 28 mmol/L (ref 22–32)
Calcium: 8.8 mg/dL — ABNORMAL LOW (ref 8.9–10.3)
Chloride: 104 mmol/L (ref 98–111)
Creatinine, Ser: 0.93 mg/dL (ref 0.61–1.24)
GFR calc Af Amer: >60 mL/min (ref >60)
GFR calc non Af Amer: >60 mL/min (ref >60)
Glucose, Bld: 125 mg/dL — ABNORMAL HIGH (ref 70–99)
Potassium: 4 mmol/L (ref 3.5–5.1)
Sodium: 140 mmol/L (ref 135–145)

## 2019-02-25 LAB — ABO/RH: ABO/RH(D): A POS

## 2019-02-25 SURGERY — INSERTION, INTRAMEDULLARY ROD, FEMUR
Anesthesia: General | Laterality: Left

## 2019-02-25 MED ORDER — FENTANYL CITRATE (PF) 100 MCG/2ML IJ SOLN
INTRAMUSCULAR | Status: AC
  Filled 2019-02-25: qty 2

## 2019-02-25 MED ORDER — ACETAMINOPHEN 10 MG/ML IV SOLN: 1000.0000 mg | Freq: Once | INTRAVENOUS | Status: DC | PRN

## 2019-02-25 MED ORDER — 0.9 % SODIUM CHLORIDE (POUR BTL) OPTIME
TOPICAL | Status: DC | PRN
  Administered 2019-02-25: 1000 mL

## 2019-02-25 MED ORDER — FENTANYL CITRATE (PF) 250 MCG/5ML IJ SOLN
INTRAMUSCULAR | Status: DC | PRN
  Administered 2019-02-25 (×2): 50 ug via INTRAVENOUS
  Administered 2019-02-25: 100 ug via INTRAVENOUS
  Administered 2019-02-25: 50 ug via INTRAVENOUS

## 2019-02-25 MED ORDER — METOCLOPRAMIDE HCL 5 MG/ML IJ SOLN: 5.0000 mg | Freq: Three times a day (TID) | INTRAMUSCULAR | Status: DC | PRN

## 2019-02-25 MED ORDER — METOCLOPRAMIDE HCL 5 MG PO TABS: 5.0000 mg | ORAL_TABLET | Freq: Three times a day (TID) | ORAL | Status: DC | PRN

## 2019-02-25 MED ORDER — SODIUM CHLORIDE 0.9 % IV SOLN
INTRAVENOUS | Status: DC | PRN
  Administered 2019-02-25: 25 ug/min via INTRAVENOUS

## 2019-02-25 MED ORDER — MIDAZOLAM HCL 2 MG/2ML IJ SOLN
INTRAMUSCULAR | Status: AC
  Filled 2019-02-25: qty 2

## 2019-02-25 MED ORDER — CEFAZOLIN SODIUM-DEXTROSE 2-4 GM/100ML-% IV SOLN
2.0000 g | Freq: Four times a day (QID) | INTRAVENOUS | Status: AC
  Administered 2019-02-25 – 2019-02-26 (×3): 2 g via INTRAVENOUS
  Filled 2019-02-25 (×3): qty 100

## 2019-02-25 MED ORDER — DEXAMETHASONE SODIUM PHOSPHATE 10 MG/ML IJ SOLN
INTRAMUSCULAR | Status: AC
  Filled 2019-02-25: qty 1

## 2019-02-25 MED ORDER — ONDANSETRON HCL 4 MG/2ML IJ SOLN
INTRAMUSCULAR | Status: AC
  Filled 2019-02-25: qty 2

## 2019-02-25 MED ORDER — SUCCINYLCHOLINE CHLORIDE 200 MG/10ML IV SOSY
PREFILLED_SYRINGE | INTRAVENOUS | Status: DC | PRN
  Administered 2019-02-25: 100 mg via INTRAVENOUS

## 2019-02-25 MED ORDER — ACETAMINOPHEN 10 MG/ML IV SOLN
INTRAVENOUS | Status: DC | PRN
  Administered 2019-02-25: 1000 mg via INTRAVENOUS

## 2019-02-25 MED ORDER — ROCURONIUM BROMIDE 10 MG/ML (PF) SYRINGE
PREFILLED_SYRINGE | INTRAVENOUS | Status: DC | PRN
  Administered 2019-02-25: 20 mg via INTRAVENOUS
  Administered 2019-02-25: 50 mg via INTRAVENOUS

## 2019-02-25 MED ORDER — SUCCINYLCHOLINE CHLORIDE 200 MG/10ML IV SOSY
PREFILLED_SYRINGE | INTRAVENOUS | Status: AC
  Filled 2019-02-25: qty 10

## 2019-02-25 MED ORDER — DEXAMETHASONE SODIUM PHOSPHATE 10 MG/ML IJ SOLN
INTRAMUSCULAR | Status: DC | PRN
  Administered 2019-02-25: 10 mg via INTRAVENOUS

## 2019-02-25 MED ORDER — PROPOFOL 10 MG/ML IV BOLUS
INTRAVENOUS | Status: DC | PRN
  Administered 2019-02-25: 150 mg via INTRAVENOUS

## 2019-02-25 MED ORDER — FENTANYL CITRATE (PF) 250 MCG/5ML IJ SOLN
INTRAMUSCULAR | Status: AC
  Filled 2019-02-25: qty 5

## 2019-02-25 MED ORDER — ROCURONIUM BROMIDE 10 MG/ML (PF) SYRINGE
PREFILLED_SYRINGE | INTRAVENOUS | Status: AC
  Filled 2019-02-25: qty 10

## 2019-02-25 MED ORDER — MIDAZOLAM HCL 2 MG/2ML IJ SOLN
INTRAMUSCULAR | Status: DC | PRN
  Administered 2019-02-25: 2 mg via INTRAVENOUS

## 2019-02-25 MED ORDER — SUGAMMADEX SODIUM 200 MG/2ML IV SOLN
INTRAVENOUS | Status: DC | PRN
  Administered 2019-02-25: 200 mg via INTRAVENOUS

## 2019-02-25 MED ORDER — LACTATED RINGERS IV SOLN
INTRAVENOUS | Status: DC
  Administered 2019-02-25 (×2): via INTRAVENOUS

## 2019-02-25 MED ORDER — LIDOCAINE 2% (20 MG/ML) 5 ML SYRINGE
INTRAMUSCULAR | Status: DC | PRN
  Administered 2019-02-25: 60 mg via INTRAVENOUS

## 2019-02-25 MED ORDER — PROPOFOL 10 MG/ML IV BOLUS
INTRAVENOUS | Status: AC
  Filled 2019-02-25: qty 20

## 2019-02-25 MED ORDER — ONDANSETRON HCL 4 MG/2ML IJ SOLN: 4.0000 mg | Freq: Once | INTRAMUSCULAR | Status: DC | PRN

## 2019-02-25 MED ORDER — ACETAMINOPHEN 10 MG/ML IV SOLN
INTRAVENOUS | Status: AC
  Filled 2019-02-25: qty 100

## 2019-02-25 MED ORDER — PHENYLEPHRINE HCL (PRESSORS) 10 MG/ML IV SOLN
INTRAVENOUS | Status: DC | PRN
  Administered 2019-02-25: 80 ug via INTRAVENOUS
  Administered 2019-02-25: 120 ug via INTRAVENOUS
  Administered 2019-02-25: 80 ug via INTRAVENOUS

## 2019-02-25 MED ORDER — FENTANYL CITRATE (PF) 100 MCG/2ML IJ SOLN
25.0000 ug | INTRAMUSCULAR | Status: DC | PRN
  Administered 2019-02-25: 25 ug via INTRAVENOUS

## 2019-02-25 MED ORDER — ONDANSETRON HCL 4 MG/2ML IJ SOLN
INTRAMUSCULAR | Status: DC | PRN
  Administered 2019-02-25: 4 mg via INTRAVENOUS

## 2019-02-25 SURGICAL SUPPLY — 54 items
APL PRP STRL LF DISP 70% ISPRP (MISCELLANEOUS) ×2
BANDAGE ACE 4X5 VEL STRL LF (GAUZE/BANDAGES/DRESSINGS) ×3 IMPLANT
BANDAGE ELASTIC 6 VELCRO ST LF (GAUZE/BANDAGES/DRESSINGS) ×3 IMPLANT
BIT DRILL CALIBRATED 4.3MMX365 (DRILL) IMPLANT
BIT DRILL CROWE PNT TWST 4.5MM (DRILL) IMPLANT
BLADE SURG 10 STRL SS (BLADE) ×4 IMPLANT
BNDG CMPR MED 10X6 ELC LF (GAUZE/BANDAGES/DRESSINGS) ×1
BNDG ELASTIC 6X10 VLCR STRL LF (GAUZE/BANDAGES/DRESSINGS) ×2 IMPLANT
BRUSH SCRUB SURG 4.25 DISP (MISCELLANEOUS) ×4 IMPLANT
CHLORAPREP W/TINT 26 (MISCELLANEOUS) ×5 IMPLANT
COVER SURGICAL LIGHT HANDLE (MISCELLANEOUS) ×3 IMPLANT
DRAPE C-ARM 35X43 STRL (DRAPES) ×3 IMPLANT
DRAPE C-ARMOR (DRAPES) ×3 IMPLANT
DRAPE HALF SHEET 40X57 (DRAPES) ×6 IMPLANT
DRAPE IMP U-DRAPE 54X76 (DRAPES) ×6 IMPLANT
DRAPE ORTHO SPLIT 77X108 STRL (DRAPES) ×6
DRAPE SURG 17X23 STRL (DRAPES) ×3 IMPLANT
DRAPE SURG ORHT 6 SPLT 77X108 (DRAPES) ×2 IMPLANT
DRAPE U-SHAPE 47X51 STRL (DRAPES) ×3 IMPLANT
DRILL CALIBRATED 4.3MMX365 (DRILL) ×3
DRILL CROWE POINT TWIST 4.5MM (DRILL) ×3
DRSG MEPILEX BORDER 4X4 (GAUZE/BANDAGES/DRESSINGS) ×4 IMPLANT
ELECT REM PT RETURN 9FT ADLT (ELECTROSURGICAL) ×3
ELECTRODE REM PT RTRN 9FT ADLT (ELECTROSURGICAL) ×1 IMPLANT
GAUZE SPONGE 4X4 12PLY STRL (GAUZE/BANDAGES/DRESSINGS) ×2 IMPLANT
GAUZE XEROFORM 1X8 LF (GAUZE/BANDAGES/DRESSINGS) ×2 IMPLANT
GLOVE BIOGEL M STRL SZ7.5 (GLOVE) ×5 IMPLANT
GLOVE BIOGEL PI IND STRL 8 (GLOVE) ×1 IMPLANT
GLOVE BIOGEL PI INDICATOR 8 (GLOVE) ×2
GOWN STRL REUS W/ TWL LRG LVL3 (GOWN DISPOSABLE) ×1 IMPLANT
GOWN STRL REUS W/ TWL XL LVL3 (GOWN DISPOSABLE) ×1 IMPLANT
GOWN STRL REUS W/TWL LRG LVL3 (GOWN DISPOSABLE) ×3
GOWN STRL REUS W/TWL XL LVL3 (GOWN DISPOSABLE) ×3
GUIDEWIRE BEAD TIP (WIRE) ×2 IMPLANT
KIT BASIN OR (CUSTOM PROCEDURE TRAY) ×3 IMPLANT
KIT TURNOVER KIT B (KITS) ×3 IMPLANT
NAIL FEM RETRO 12X420 (Nail) ×2 IMPLANT
NS IRRIG 1000ML POUR BTL (IV SOLUTION) ×3 IMPLANT
PACK GENERAL/GYN (CUSTOM PROCEDURE TRAY) ×3 IMPLANT
PAD ARMBOARD 7.5X6 YLW CONV (MISCELLANEOUS) ×8 IMPLANT
PADDING CAST COTTON 6X4 STRL (CAST SUPPLIES) ×2 IMPLANT
SCREW CORT TI DBL LEAD 5X34 (Screw) ×4 IMPLANT
SCREW CORT TI DBL LEAD 5X58 (Screw) ×2 IMPLANT
SCREW CORT TI DBL LEAD 5X65 (Screw) ×2 IMPLANT
SCREW CORT TI DBL LEAD 5X80 (Screw) ×2 IMPLANT
SCREW CORT TI DBL LEAD 5X85 (Screw) ×2 IMPLANT
STAPLER VISISTAT 35W (STAPLE) ×3 IMPLANT
STOCKINETTE IMPERVIOUS LG (DRAPES) ×3 IMPLANT
SUT MON AB 3-0 SH 27 (SUTURE) ×3
SUT MON AB 3-0 SH27 (SUTURE) ×1 IMPLANT
SUT PDS AB 2-0 CT1 27 (SUTURE) ×3 IMPLANT
TOWEL OR 17X26 10 PK STRL BLUE (TOWEL DISPOSABLE) ×6 IMPLANT
UNDERPAD 30X30 (UNDERPADS AND DIAPERS) ×3 IMPLANT
WATER STERILE IRR 1000ML POUR (IV SOLUTION) ×3 IMPLANT

## 2019-02-25 NOTE — Progress Notes (Signed)
PROGRESS NOTE  Mark Pham ALP:[id number] DOB: 07/31/1946 DOA: 02/24/2019 PCP: Mark Bellows, PA-C  Brief History   Mark Pham is a 73 y.o. male with medical history significant of HTN, PAF s/p cryoablation 10/2013 on aspirin, HLD, CAD (nonobstructive by cath in 2012), asthma, and hypothyroidism; who presents with complaints of left leg pain.  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  He reports that knee went outward and he heard a pop.  He fell back into the pool to avoid falling on the concrete.  Denies any loss of consciousness or trauma to his head.  He complained of having severe pain and saw the deformity in his leg.  Denies any break in the skin, but it is on the same side that he has had previous knee replacement.  It is symptoms include a burning tingling sensation down the left leg.    In route with EMS patient was given 150 mcg of fentanyl and 4 mg of Zofran.  ED Course: Upon admission into the emergency department patient was noted to be afebrile, pulse 53-58, blood pressure elevated up to 185/86, and all other vital signs maintained.  Labs revealed WBC 8.3 and hemoglobin 12.2.  X-rays of left femur revealed a spiral type distal fracture.  COVID-19 screening negative.  Orthopedics consulted and plan on likely surgical procedure in a.m.  The patient was admitted to a medical bed. Orthopedic surgery was consulted and the patient underwent an intramedullary nail of the left periprostetic distal femur. The patient has tolerated the procedure well.  Consultants  . Orthopedic surgery  Procedures  . Intramedullary Nailing  Antibiotics   Anti-infectives (From admission, onward)   Start     Dose/Rate Route Frequency Ordered Stop   02/25/19 1700  ceFAZolin (ANCEF) IVPB 2g/100 mL premix     2 g 200 mL/hr over 30 Minutes Intravenous Every 6 hours 02/25/19 1353 02/26/19 0511   02/25/19 0600  ceFAZolin  (ANCEF) IVPB 2g/100 mL premix     2 g 200 mL/hr over 30 Minutes Intravenous On call to O.R. 02/24/19 1846 02/25/19 1103      Subjective  The patient is seen postoperatively. He is somnolent. No new complaints.  Objective   Vitals:  Vitals:   02/25/19 1320 02/25/19 1335  BP: 117/72 117/73  Pulse: 68 66  Resp: 16 13  Temp:  (!) 97.2 F (36.2 C)  SpO2: 94% 93%   Exam:  Constitutional:       The patient  Respiratory:  . CTA bilaterally, no w/r/r.  . No tactile fremitus . No increased work of breathing. Cardiovascular:  . Regular rate and rhythm. . No murmurs, ectopy, or gallups. . No lateral PMI. No thrills. Abdomen:  . Abdomen is soft, non-tender, non-distended. . No hernias, masses, or organomegaly. . Normoactive bowel sounds Musculoskeletal:  . No cyanosis, clubbing, or edema.  [name] capillary refill. Skin:  . No rashes, lesions, ulcers . palpation of skin: no induration or nodules Neurologic:  . Unable to evaluate as patient is unable to cooperate with exam. Psychiatric:  . Unable to evaluate as patient is unable to cooperate with exam.  I have personally reviewed the following:   Today's Data  . Vitals, CBC, BMP  Scheduled Meds: . aspirin  81 mg Oral Daily  . atenolol  25 mg Oral Daily  . colesevelam  1,875 mg Oral BID WC  . diltiazem  240  mg Oral Daily  . enoxaparin (LOVENOX) injection  40 mg Subcutaneous Q24H  . febuxostat  40 mg Oral BID  . fentaNYL      . ferrous sulfate  325 mg Oral Q breakfast  . levothyroxine  75 mcg Oral QAC breakfast  . losartan  100 mg Oral Daily  . mometasone-formoterol  2 puff Inhalation BID   Continuous Infusions: .  ceFAZolin (ANCEF) IV    . lactated ringers 10 mL/hr at 02/25/19 0946  . methocarbamol (ROBAXIN) IV      Active Problems:   Closed fracture of left distal femur (HCC)   Hypothyroidism   Type 2 diabetes mellitus without complication (HCC)   Normocytic anemia   Paroxysmal atrial fibrillation (HCC)    LOS: 1 day    A & P  Distal femur fracture: Acute.  Patient reports getting out of the pool when his leg went away and he heard a pop.  X-rays reveal a left distal spiralfemur fracture.  Orthopedics performed intramedullary nailing on 02/25/2019. The patient has tolerated the procedure well. Social work has been consulted. Admit to medical telemetry bed. Hip fracture order set utilized. PT/OT to evaluate.  Normocytic anemia: Hemoglobin 11.0 g/dL.  No reports of bleeding. Monitor.  Essential hypertension: Blood pressures well controlled on atenolol, diltiazem, and losartan which have been continued as at home. Monitor.   Paroxysmal atrial fibrillation: Patient with previous history of atrial fibrillation back in 10/2013.  Patient reports being on anticoagulation for a 1-2 weeks prior to undergoing cryoablation.  Since that time he has been on a daily aspirin and followed by cardiology.  Currently in sinus rhythm. Aspirin and diltiazem have been continued.  Coronary artery disease: Noted to be nonobstructive by cardiac cath in 2012. Continue aspirin, atenolol, and welchol.   Hypothyroidism: TSH 3.324 on 12/30/2018. Continue levothyroxine.  History of asthma: Patient without complaints of shortness of breath for all. CXR demonstrated no ative disease. Pharmacy substitution of Dulera. Albuterol nebs as needed for shortness of breath/wheezing.  Diabetes mellitus type 2: Patient appears well controlled on metformin.  Last hemoglobin A1c 5.8 on 12/30/2018. Metformin held while patient is inpatient. The patient is receiving a Heart healthy and carb modified diet. Continue gabapentin as needed for neuropathy.  Obesity: BMI 31.63 kg/m: Complicates all cares. Continue to counsel on need of weight loss.  I have seen and examined this patient myself. I have spent 34 minutes in his evaluation and care  DVT prophylaxis: lovenox  Code Status: Full Family Communication: Patient reports that he is able  to update his wife Disposition Plan: Will likely need rehab in 2 to 3 days pending surgery  Geno Sydnor, DO [facility] Direct contact: see [email or web address]  7PM-7AM contact night coverage as above 02/25/2019, 3:50 PM  LOS: 1 day

## 2019-02-25 NOTE — Transfer of Care (Signed)
Immediate Anesthesia Transfer of Care Note  Patient: Iasiah Ozment  Procedure(s) Performed: INTRAMEDULLARY (IM) NAIL FEMORAL (Left )  Patient Location: PACU  Anesthesia Type:General  Level of Consciousness: awake, alert  and oriented  Airway & Oxygen Therapy: Patient Spontanous Breathing and Patient connected to face mask oxygen  Post-op Assessment: Report given to RN and Post -op Vital signs reviewed and stable  Post vital signs: Reviewed and stable  Last Vitals:  Vitals Value Taken Time  BP 128/82 02/25/19 1303  Temp    Pulse 71 02/25/19 1306  Resp 20 02/25/19 1306  SpO2 96 % 02/25/19 1306  Vitals shown include unvalidated device data.  Last Pain:  Vitals:   02/25/19 0853  TempSrc:   PainSc: 7       Patients Stated Pain Goal: 2 (54/65/68 1275)  Complications: No apparent anesthesia complications

## 2019-02-25 NOTE — Anesthesia Preprocedure Evaluation (Addendum)
Anesthesia Evaluation  Patient identified by MRN, date of birth, ID band Patient awake    Reviewed: Allergy & Precautions, NPO status , Patient's Chart, lab work & pertinent test results, reviewed documented beta blocker date and time   Airway Mallampati: III  TM Distance: >3 FB Neck ROM: Full    Dental no notable dental hx.    Pulmonary asthma ,    Pulmonary exam normal breath sounds clear to auscultation       Cardiovascular hypertension, Pt. on home beta blockers Normal cardiovascular exam+ dysrhythmias Atrial Fibrillation  Rhythm:Regular Rate:Normal  ECG: NSR, rate 71  Sees cardiologist Ferdinand Lango)   Neuro/Psych negative neurological ROS  negative psych ROS   GI/Hepatic negative GI ROS, Neg liver ROS,   Endo/Other  diabetes, Oral Hypoglycemic AgentsHypothyroidism   Renal/GU negative Renal ROS     Musculoskeletal Scoliosis   Abdominal (+) + obese,   Peds  Hematology  (+) anemia ,   Anesthesia Other Findings  Left femur fx  Reproductive/Obstetrics                            Anesthesia Physical Anesthesia Plan  ASA: III  Anesthesia Plan: General   Post-op Pain Management:    Induction: Intravenous  PONV Risk Score and Plan: 3 and Ondansetron, Dexamethasone, Midazolam and Treatment may vary due to age or medical condition  Airway Management Planned: Oral ETT  Additional Equipment:   Intra-op Plan:   Post-operative Plan: Extubation in OR  Informed Consent: I have reviewed the patients History and Physical, chart, labs and discussed the procedure including the risks, benefits and alternatives for the proposed anesthesia with the patient or authorized representative who has indicated his/her understanding and acceptance.     Dental advisory given  Plan Discussed with: CRNA  Anesthesia Plan Comments:         Anesthesia Quick Evaluation

## 2019-02-25 NOTE — Progress Notes (Signed)
Pt's ring, watch, and phone charging put in pt belongings bag in cabinet for surgery.

## 2019-02-25 NOTE — Op Note (Signed)
Mark Pham male 73 y.o. 02/25/2019  PreOperative Diagnosis: Left periprosthetic distal femur fracture  PostOperative Diagnosis: Same  PROCEDURE: Intramedullary nail of left periprosthetic distal femur fracture  SURGEON: Melony Overly, MD  ASSISTANT: None  ANESTHESIA: General ET tube  FINDINGS: Left periprosthetic distal femur fracture  IMPLANTS: [employer] [place] retrograde nail size of 12 x 9  INDICATIONS:73 y.o. male felt his knee give out while poolside with his granddaughter.  He fell and had pain and deformity in his left leg.  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  In the emergency department he was diagnosed with a spiral fracture of his distal femur above the total knee arthroplasty.  He was placed in Buck's traction.  He was admitted to the hospitalist team.  He has a history of prediabetes but is otherwise healthy.  Given his fracture he was indicated for open treatment.  We discussed the risk, benefits alternatives of surgery which include but are not limited to wound healing complications, nonunion, malunion, need for further surgery, demonstrating structures.  We also discussed the perioperative and anesthetic risk which include death.  After weighing these risks he wished to proceed.  He understood the weightbearing restrictions postoperatively and agreed to comply.  PROCEDURE: Patient was identified in the preoperative holding area.  The left leg was marked by myself.  Consent was signed by myself and the patient.  He was taken to the operative suite where general anesthesia was induced without difficulty.  He was then moved to the operative table in the supine position.  A bump was placed on the left hip.  All bony prominences were well-padded.  Preoperative antibiotics were given.  The left lower extremity was prepped and draped in the usual sterile fashion.  Surgical timeout was performed.  We began by  taking x-rays of the fracture site and pulling distal traction using a triangle were able to manipulate the fracture into an acceptable position.  Then a longitudinal incision was made overlying the previous total knee arthroplasty site just distal to the patella.  This taken sharply down through skin, subcutaneous tissue and the patellar tendon.  We entered the knee joint there was a rush of synovial fluid.  Then the guidepin was placed at the appropriate starting position of the distal femur and this was confirmed on fluoroscopy.  It was ran into the distal segment.  Then the opening reamer was used in the distal fragment.  Then the finger reducer was used to gain reduction of the fracture site.  The guidepin was placed up to the level of the greater trochanter.  Measuring device was used to measure the length.  Then the finger reducer was removed and we sequentially reamed to a 13.5.  There was not much chatter even at 13.5.  A 12 mm nail was chosen.  Then all 5 distal interlock screws were placed without difficulty.  2 proximal interlocking screws were placed using a perfect circle technique.  Fluoroscopy confirmed appropriate screw length and fracture reduction after nail placement.   The wounds were then irrigated copiously with normal saline.  2-0 PDS suture was used to close the patellar tendon and peritenon.  3-0 Monocryl was used to close the subcuticular tissue and staples for the skin.  He tolerated the procedure well.  There were no complications.  He was awakened from anesthesia and taken to recovery in stable condition.  All counts were correct at the  end of the case.  POST OPERATIVE INSTRUCTIONS: Touchdown weightbearing to left lower extremity Okay to restart Lovenox tomorrow He will follow-up with me in 2 weeks for wound check, x-rays of the left femur and suture removal Call the office with concerns He will mobilize with physical therapy to determine disposition  TOURNIQUET TIME: No  tourniquet was used  BLOOD LOSS:  200 mL         DRAINS: none         SPECIMEN: none       COMPLICATIONS:  * No complications entered in OR log *         Disposition: PACU - hemodynamically stable.         Condition: stable

## 2019-02-25 NOTE — Progress Notes (Signed)
Orthopedic Tech Progress Note Patient Details:  Mark Pham May 21, 1946 [id number]  Patient ID: Mark Pham, male   DOB: May 15, 1946, 73 y.o.   MRN: [id number]   Mark Pham 02/25/2019, 10:43 AMSand bag with Extra string.

## 2019-02-25 NOTE — Anesthesia Postprocedure Evaluation (Signed)
Anesthesia Post Note  Patient: Mark Pham  Procedure(s) Performed: INTRAMEDULLARY (IM) NAIL FEMORAL (Left )     Patient location during evaluation: PACU Anesthesia Type: General Level of consciousness: awake and alert Pain management: pain level controlled Vital Signs Assessment: post-procedure vital signs reviewed and stable Respiratory status: spontaneous breathing, nonlabored ventilation, respiratory function stable and patient connected to nasal cannula oxygen Cardiovascular status: blood pressure returned to baseline and stable Postop Assessment: no apparent nausea or vomiting Anesthetic complications: no    Last Vitals:  Vitals:   02/25/19 1837 02/25/19 1945  BP: 124/81 121/90  Pulse: 71 79  Resp:  18  Temp: 36.5 C 37.1 C  SpO2: 97% 97%    Last Pain:  Vitals:   02/25/19 1945  TempSrc: Oral  PainSc:                  [name] [name]

## 2019-02-25 NOTE — Anesthesia Procedure Notes (Signed)
Procedure Name: Intubation Date/Time: 02/25/2019 11:02 AM Performed by: Clearnce Sorrel, CRNA Pre-anesthesia Checklist: Patient identified, Emergency Drugs available, Suction available, Patient being monitored and Timeout performed Patient Re-evaluated:Patient Re-evaluated prior to induction Oxygen Delivery Method: Circle system utilized Preoxygenation: Pre-oxygenation with 100% oxygen Induction Type: IV induction and Rapid sequence Laryngoscope Size: Glidescope and 4 Grade View: Grade I Tube type: Oral Tube size: 7.5 mm Number of attempts: 1 Airway Equipment and Method: Stylet Placement Confirmation: ETT inserted through vocal cords under direct vision,  positive ETCO2 and breath sounds checked- equal and bilateral Secured at: 23 cm Tube secured with: Tape Dental Injury: Teeth and Oropharynx as per pre-operative assessment

## 2019-02-26 DIAGNOSIS — D638 Anemia in other chronic diseases classified elsewhere: Secondary | ICD-10-CM

## 2019-02-26 MED ORDER — TIZANIDINE HCL 2 MG PO TABS: 2.0000 mg | ORAL_TABLET | Freq: Three times a day (TID) | ORAL | 0 refills | Status: DC

## 2019-02-26 MED ORDER — ENOXAPARIN SODIUM 40 MG/0.4ML ~~LOC~~ SOLN: 40.0000 mg | SUBCUTANEOUS | 0 refills | Status: AC

## 2019-02-26 MED ORDER — HYDROCODONE-ACETAMINOPHEN 5-325 MG PO TABS: 1.0000 | ORAL_TABLET | ORAL | 0 refills | Status: AC | PRN

## 2019-02-26 NOTE — Progress Notes (Signed)
CSW acknowledges SNF consult, pending Inpatient Rehab consult for CIR appropriateness.   [place], [place]

## 2019-02-26 NOTE — Evaluation (Signed)
Physical Therapy Evaluation Patient Details Name: Mark Pham MRN: [id number] DOB: 22-Dec-1945 Today's Date: 02/26/2019   History of Present Illness  Pt is a 73 y.o. male admitted 02/24/19 after fall sustaining L periprosthetic distal femur fx. S/p L femoral IM nail 6/27. PMH includes L TKA (2006), HTN, asthma.    Clinical Impression  Pt presents with an overall decrease in functional mobility secondary to above. PTA, pt independent, active and lives with supportive family. Educ on precautions, positioning, therex, and importance of mobility. Today, pt able to initiate transfer and gait training with RW and modA. Pt limited by pain, generalized weakness and TDWB precautions. Pt very motivated to participate and regain PLOF. Feel he would benefit from intensive CIR-level therapies to maximize functional mobility and independence prior to return home.   SpO2 93-94%    Follow Up Recommendations CIR;Supervision for mobility/OOB    Equipment Recommendations  Wheelchair (measurements PT);Wheelchair cushion (measurements PT)(TBD next venue)    Recommendations for Other Services       Precautions / Restrictions Precautions Precautions: Fall Restrictions Weight Bearing Restrictions: Yes LLE Weight Bearing: Touchdown weight bearing      Mobility  Bed Mobility Overal bed mobility: Needs Assistance Bed Mobility: Supine to Sit     Supine to sit: Min assist;HOB elevated     General bed mobility comments: Increased time and effort, reliant on UE support and minA to assist LLE to EOB  Transfers Overall transfer level: Needs assistance Equipment used: Rolling walker (2 wheeled) Transfers: Sit to/from Stand Sit to Stand: Mod assist         General transfer comment: Pt able to perform 3x sit<>stand to RW during session, from raised bed height and 2x from lower recliner height. Educ on technique; heavy reliance on UE support. MinA to ensure LLE TDWB precautions maintained. Pt reliant on  momentum and modA to assist trunk elevation  Ambulation/Gait Ambulation/Gait assistance: Min assist Gait Distance (Feet): 4 Feet Assistive device: Rolling walker (2 wheeled)   Gait velocity: Decreased   General Gait Details: Pt able to perform hop-to gait on RLE with RW from bed to recliner; minA for balance and to steady RW. Pt quickly fatigued requiring seated rest  Stairs            Wheelchair Mobility    Modified Rankin (Stroke Patients Only)       Balance Overall balance assessment: Needs assistance Sitting-balance support: Single extremity supported;No upper extremity supported Sitting balance-Leahy Scale: Fair Sitting balance - Comments: Able to don R sock, but losing balance backwards at EOB; assist to don L sock     Standing balance-Leahy Scale: Poor Standing balance comment: Reliant on UE support; unable to tolerate challenge                             Pertinent Vitals/Pain Pain Assessment: Faces Faces Pain Scale: Hurts little more Pain Location: LLE Pain Descriptors / Indicators: Guarding;Grimacing Pain Intervention(s): Monitored during session;Premedicated before session;Repositioned    Home Living Family/patient expects to be discharged to:: Private residence Living Arrangements: Spouse/significant other;Children Available Help at Discharge: Family;Available 24 hours/day Type of Home: House Home Access: Stairs to enter Entrance Stairs-Rails: Right;Left;Can reach both Entrance Stairs-Number of Steps: 3 Home Layout: One level Home Equipment: Walker - 2 wheels;Walker - 4 wheels;Bedside commode Additional Comments: Lives with wife and son. Wife with baseline rotator cuff issues, but son in good physical health and able to provide assist  Prior Function Level of Independence: Independent         Comments: Retired Holiday representative. Enjoys being active with grandchildren, involved in church and mission trips     Hand Dominance         Extremity/Trunk Assessment   Upper Extremity Assessment Upper Extremity Assessment: Overall WFL for tasks assessed    Lower Extremity Assessment Lower Extremity Assessment: LLE deficits/detail LLE Deficits / Details: s/p L femoral IM nail; knee flexion/ext 3/5, hip flex < 3/5 LLE: Unable to fully assess due to pain LLE Coordination: decreased gross motor       Communication   Communication: HOH  Cognition Arousal/Alertness: Awake/alert Behavior During Therapy: WFL for tasks assessed/performed Overall Cognitive Status: Within Functional Limits for tasks assessed                                        General Comments General comments (skin integrity, edema, etc.): SpO2 93-94% on RA    Exercises General Exercises - Lower Extremity Long Arc Quad: AROM;Left;Seated Hip Flexion/Marching: AAROM;Left;Seated Other Exercises Other Exercises: [employer] HEP handout provided: LAQ, seated hip flexion, supine ABD, heel slide, SLR   Assessment/Plan    PT Assessment Patient needs continued PT services  PT Problem List Decreased strength;Decreased range of motion;Decreased activity tolerance;Decreased balance;Decreased mobility;Decreased knowledge of use of DME;Decreased knowledge of precautions;Pain       PT Treatment Interventions DME instruction;Gait training;Stair training;Therapeutic activities;Therapeutic exercise;Balance training;Patient/family education;Wheelchair mobility training;Functional mobility training    PT Goals (Current goals can be found in the Care Plan section)  Acute Rehab PT Goals Patient Stated Goal: "I'll do as much as I can do get stronger" PT Goal Formulation: With patient Time For Goal Achievement: 03/12/19 Potential to Achieve Goals: Good    Frequency Min 5X/week   Barriers to discharge        Co-evaluation               AM-PAC PT "6 Clicks" Mobility  Outcome Measure Help needed turning from your back to your side  while in a flat bed without using bedrails?: None Help needed moving from lying on your back to sitting on the side of a flat bed without using bedrails?: None Help needed moving to and from a bed to a chair (including a wheelchair)?: A Lot Help needed standing up from a chair using your arms (e.g., wheelchair or bedside chair)?: A Lot Help needed to walk in hospital room?: A Little Help needed climbing 3-5 steps with a railing? : Total 6 Click Score: 16    End of Session Equipment Utilized During Treatment: Gait belt Activity Tolerance: Patient tolerated treatment well Patient left: in chair;with call bell/phone within reach Nurse Communication: Mobility status PT Visit Diagnosis: Other abnormalities of gait and mobility (R26.89);Pain Pain - Right/Left: Left Pain - part of body: Hip;Leg;Knee    Time: 6546-5035 PT Time Calculation (min) (ACUTE ONLY): 42 min   Charges:   PT Evaluation $PT Eval Moderate Complexity: 1 Mod PT Treatments $Therapeutic Exercise: 8-22 mins $Therapeutic Activity: 8-22 mins   Mabeline Caras, PT, DPT Acute Rehabilitation Services  Pager [phone] Office [phone]  Derry Lory 02/26/2019, 2:56 PM

## 2019-02-26 NOTE — Progress Notes (Addendum)
PROGRESS NOTE  Mark Pham WSF:[id number] DOB: 1945-09-10 DOA: 02/24/2019 PCP: Jonathon Bellows, PA-C  Brief History   Mark Pham is a 73 y.o. male with medical history significant of HTN, PAF s/p cryoablation 10/2013 on aspirin, HLD, CAD (nonobstructive by cath in 2012), asthma, and hypothyroidism; who presents with complaints of left leg pain.  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  He reports that knee went outward and he heard a pop.  He fell back into the pool to avoid falling on the concrete.  Denies any loss of consciousness or trauma to his head.  He complained of having severe pain and saw the deformity in his leg.  Denies any break in the skin, but it is on the same side that he has had previous knee replacement.  It is symptoms include a burning tingling sensation down the left leg.     In route with EMS patient was given 150 mcg of fentanyl and 4 mg of Zofran.   ED Course: Upon admission into the emergency department patient was noted to be afebrile, pulse 53-58, blood pressure elevated up to 185/86, and all other vital signs maintained.  Labs revealed WBC 8.3 and hemoglobin 12.2.  X-rays of left femur revealed a spiral type distal fracture.  COVID-19 screening negative.  Orthopedics consulted and plan on likely surgical procedure in a.m.  The patient was admitted to a medical bed. Orthopedic surgery was consulted and the patient underwent an intramedullary nail of the left periprostetic distal femur. The patient has tolerated the procedure well. Will discharge once recommendations received from therapies.  Consultants  . Orthopedic surgery  Procedures  . Intramedullary Nailing  Antibiotics   Anti-infectives (From admission, onward)    Start     Dose/Rate Route Frequency Ordered Stop   02/25/19 1700  ceFAZolin (ANCEF) IVPB 2g/100 mL premix     2 g 200 mL/hr over 30 Minutes Intravenous Every 6 hours  02/25/19 1353 02/26/19 0511   02/25/19 0600  ceFAZolin (ANCEF) IVPB 2g/100 mL premix     2 g 200 mL/hr over 30 Minutes Intravenous On call to O.R. 02/24/19 1846 02/25/19 1103       Subjective  The patient is resting comfortably. No new complaints.  Objective   Vitals:  Vitals:   02/26/19 0749 02/26/19 0810  BP: 137/78   Pulse: 69   Resp: 18   Temp: 98.6 F (37 C)   SpO2: 94% 94%   Exam:  Constitutional:       The patient is awake, alert, and oriented x 2. He is anxious to go home. No acute distress. Respiratory:  . CTA bilaterally, no w/r/r.  . No tactile fremitus . No increased work of breathing. Cardiovascular:  . Regular rate and rhythm. . No murmurs, ectopy, or gallups. . No lateral PMI. No thrills. Abdomen:  . Abdomen is soft, non-tender, non-distended. . No hernias, masses, or organomegaly. . Normoactive bowel sounds Musculoskeletal:  . No cyanosis, clubbing, or edema.  Mark Pham capillary refill. Skin:  . No rashes, lesions, ulcers . palpation of skin: no induration or nodules Neurologic:  . Unable to evaluate as patient is unable to cooperate with exam. Psychiatric:  . Unable to evaluate as patient is unable to cooperate with exam.  I have personally reviewed the following:   Today's Data  . Vitals, CBC, BMP  Scheduled Meds: . aspirin  81 mg Oral Daily  .  atenolol  25 mg Oral Daily  . colesevelam  1,875 mg Oral BID WC  . diltiazem  240 mg Oral Daily  . enoxaparin (LOVENOX) injection  40 mg Subcutaneous Q24H  . febuxostat  40 mg Oral BID  . ferrous sulfate  325 mg Oral Q breakfast  . levothyroxine  75 mcg Oral QAC breakfast  . losartan  100 mg Oral Daily  . mometasone-formoterol  2 puff Inhalation BID   Continuous Infusions: . lactated ringers 10 mL/hr at 02/25/19 0946  . methocarbamol (ROBAXIN) IV      Active Problems:   Closed fracture of left distal femur (HCC)   Hypothyroidism   Type 2 diabetes mellitus without complication (HCC)    Normocytic anemia   Paroxysmal atrial fibrillation (HCC)   LOS: 2 days    A & P  Distal femur fracture: Acute.  Patient reports getting out of the pool when his leg went away and he heard a pop.  X-rays reveal a left distal spiralfemur fracture.  Orthopedics performed intramedullary nailing on 02/25/2019. The patient has tolerated the procedure well. Social work has been consulted. Admit to medical telemetry bed. Hip fracture order set utilized. Awaiting PT/OT to evaluate.   Anemia: Mixed etiology: Hemoglobin 11.0 g/dL preoperative is likely due to anemia of chronic disease. The drop in hemoglobin to 8.9 postoperatively is likely due to post op losses and dilutional effects. There are no reports of bleeding. Monitor.   Essential hypertension: Blood pressures well controlled on atenolol, diltiazem, and losartan which have been continued as at home. Monitor.   Paroxysmal atrial fibrillation: Patient with previous history of atrial fibrillation back in 10/2013.  Patient reports being on anticoagulation for a 1-2 weeks prior to undergoing cryoablation.  Since that time he has been on a daily aspirin and followed by cardiology.  Currently in sinus rhythm. Aspirin and diltiazem have been continued.   Coronary artery disease: Noted to be nonobstructive by cardiac cath in 2012. Continue aspirin, atenolol, and welchol.    Hypothyroidism: TSH 3.324 on 12/30/2018. Continue levothyroxine.   History of asthma: Patient without complaints of shortness of breath for all. CXR demonstrated no ative disease. Pharmacy substitution of Dulera. Albuterol nebs as needed for shortness of breath/wheezing.   Diabetes mellitus type 2: Patient appears well controlled on metformin.  Last hemoglobin A1c 5.8 on 12/30/2018. Metformin held while patient is inpatient. The patient is receiving a Heart healthy and carb modified diet. Continue gabapentin as needed for neuropathy.   Obesity: BMI 31.63 kg/m: Complicates all cares.  Continue to counsel on need of weight loss.  I have seen and examined this patient myself. I have spent 30 minutes in his evaluation and care   DVT prophylaxis: lovenox  Code Status: Full Family Communication: Patient reports that he is able to update his wife Disposition Plan: Will likely need rehab in 2 to 3 days pending surgery  Mark Karel, DO [facility] Direct contact: see [email or web address]  7PM-7AM contact night coverage as above 02/26/2019, 3:50 PM  LOS: 1 day

## 2019-02-26 NOTE — Progress Notes (Signed)
Mark Pham is a 73 y.o. male   Orthopaedic diagnosis: Left periprosthetic distal femur fracture status post IM nail  Subjective: Mark Pham is doing well today.  His pain has improved since surgery.  He notes some soreness at the fracture site as well as at the knee.  He has not been out of bed with therapy yet.  Denies any shortness of breath.  Is asking about disposition and weightbearing.  Objectyive: Vitals:   02/26/19 0749 02/26/19 0810  BP: 137/78   Pulse: 69   Resp: 18   Temp: 98.6 F (37 C)   SpO2: 94% 94%     Exam: Awake and alert Respirations even and unlabored No acute distress  Left leg with dressing in place about the knee.  Patient is able to extend and flex the knee gently.  No drainage to the more proximal dressing.  Able to dorsiflex and plantarflex the ankle.  Some swelling about the leg but no tenderness to palpation about the calf.  No signs of compartment syndrome.  Endorses sensation light touch in the dorsal and plantar foot.  Palpable dorsalis pedis pulse  Assessment: Postop day 1 status post left periprosthetic distal femur fracture IM nailing   Plan: He will work with physical therapy today.   He is touchdown weightbearing on the left lower extremity He will continue Lovenox and be discharged on Lovenox for 2 weeks.   He will follow-up with me in 2 weeks for x-rays and suture removal He has completed his perioperative antibiotics    Radene Journey, MD

## 2019-02-26 NOTE — Progress Notes (Signed)
Pt updated wife this AM.

## 2019-02-26 NOTE — Plan of Care (Signed)
  Problem: Pain Managment: Goal: General experience of comfort will improve Outcome: Progressing   

## 2019-02-27 ENCOUNTER — Encounter (HOSPITAL_COMMUNITY): Payer: Self-pay | Admitting: Orthopaedic Surgery

## 2019-02-27 LAB — BASIC METABOLIC PANEL
Anion gap: 9 (ref 5–15)
BUN: 25 mg/dL — ABNORMAL HIGH (ref 8–23)
CO2: 23 mmol/L (ref 22–32)
Calcium: 8.3 mg/dL — ABNORMAL LOW (ref 8.9–10.3)
Chloride: 106 mmol/L (ref 98–111)
Creatinine, Ser: 1 mg/dL (ref 0.61–1.24)
GFR calc Af Amer: >60 mL/min (ref >60)
GFR calc non Af Amer: >60 mL/min (ref >60)
Glucose, Bld: 101 mg/dL — ABNORMAL HIGH (ref 70–99)
Potassium: 4 mmol/L (ref 3.5–5.1)
Sodium: 138 mmol/L (ref 135–145)

## 2019-02-27 LAB — CBC WITH DIFFERENTIAL/PLATELET
Abs Immature Granulocytes: 0.05 10*3/uL (ref 0.00–0.07)
Basophils Absolute: 0 10*3/uL (ref 0.0–0.1)
Basophils Relative: 0 %
Eosinophils Absolute: 0.2 10*3/uL (ref 0.0–0.5)
Eosinophils Relative: 2 %
HCT: 28.3 % — ABNORMAL LOW (ref 39.0–52.0)
Hemoglobin: 8.9 g/dL — ABNORMAL LOW (ref 13.0–17.0)
Immature Granulocytes: 1 %
Lymphocytes Relative: 15 %
Lymphs Abs: 1.3 10*3/uL (ref 0.7–4.0)
MCH: 29.1 pg (ref 26.0–34.0)
MCHC: 31.4 g/dL (ref 30.0–36.0)
MCV: 92.5 fL (ref 80.0–100.0)
Monocytes Absolute: 0.9 10*3/uL (ref 0.1–1.0)
Monocytes Relative: 10 %
Neutro Abs: 6.2 10*3/uL (ref 1.7–7.7)
Neutrophils Relative %: 72 %
Platelets: 126 10*3/uL — ABNORMAL LOW (ref 150–400)
RBC: 3.06 MIL/uL — ABNORMAL LOW (ref 4.22–5.81)
RDW: 15.4 % (ref 11.5–15.5)
WBC: 8.6 10*3/uL (ref 4.0–10.5)
nRBC: 0 % (ref 0.0–0.2)

## 2019-02-27 MED ORDER — DOCUSATE SODIUM 100 MG PO CAPS
200.0000 mg | ORAL_CAPSULE | Freq: Once | ORAL | Status: AC
  Administered 2019-02-27: 200 mg via ORAL
  Filled 2019-02-27: qty 2

## 2019-02-27 MED ORDER — BISACODYL 10 MG RE SUPP
10.0000 mg | Freq: Once | RECTAL | Status: AC
  Administered 2019-02-27: 10 mg via RECTAL
  Filled 2019-02-27: qty 1

## 2019-02-27 NOTE — Progress Notes (Addendum)
PROGRESS NOTE  Mark Pham RUE:[id number] DOB: [birth year] DOA: 02/24/2019 PCP: Bailey MechPodraza, Cole Christopher, PA-C  Brief History   Mark Pham is a 73 y.o. male with medical history significant of HTN, PAF s/p cryoablation 10/2013 on aspirin, HLD, CAD (nonobstructive by cath in 2012), asthma, and hypothyroidism; who presents with complaints of left leg pain.  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  He reports that knee went outward and he heard a pop.  He fell back into the pool to avoid falling on the concrete.  Denies any loss of consciousness or trauma to his head.  He complained of having severe pain and saw the deformity in his leg.  Denies any break in the skin, but it is on the same side that he has had previous knee replacement.  It is symptoms include a burning tingling sensation down the left leg.     In route with EMS patient was given 150 mcg of fentanyl and 4 mg of Zofran.   ED Course: Upon admission into the emergency department patient was noted to be afebrile, pulse 53-58, blood pressure elevated up to 185/86, and all other vital signs maintained.  Labs revealed WBC 8.3 and hemoglobin 12.2.  X-rays of left femur revealed a spiral type distal fracture.  COVID-19 screening negative.  Orthopedics consulted and plan on likely surgical procedure in a.m.  The patient was admitted to a medical bed. Orthopedic surgery was consulted and the patient underwent an intramedullary nail of the left periprostetic distal femur. The patient has tolerated the procedure well. PTOT has made recommendations that the patient go to inpatient rehab. They have been consulted to evaluate the patient. Consultants  . Orthopedic surgery  Procedures  . Intramedullary Nailing  Antibiotics   Anti-infectives (From admission, onward)   Start     Dose/Rate Route Frequency Ordered Stop   02/25/19 1700  ceFAZolin (ANCEF) IVPB 2g/100 mL premix     2  g 200 mL/hr over 30 Minutes Intravenous Every 6 hours 02/25/19 1353 02/26/19 0511   02/25/19 0600  ceFAZolin (ANCEF) IVPB 2g/100 mL premix     2 g 200 mL/hr over 30 Minutes Intravenous On call to O.R. 02/24/19 1846 02/25/19 1103      Subjective  The patient is resting comfortably. No new complaints.  Objective   Vitals:  Vitals:   02/27/19 0816 02/27/19 0834  BP: (!) 156/86   Pulse: 61   Resp: 16   Temp: 98.2 F (36.8 C)   SpO2: 93% 92%   Exam:  Constitutional:       The patient is awake, alert, and oriented x 2.  No acute distress. Respiratory:  . CTA bilaterally, no w/r/r.  . No tactile fremitus . No increased work of breathing. Cardiovascular:  . Regular rate and rhythm. . No murmurs, ectopy, or gallups. . No lateral PMI. No thrills. Abdomen:  . Abdomen is soft, non-tender, non-distended. . No hernias, masses, or organomegaly. . Normoactive bowel sounds Musculoskeletal:  . No cyanosis, clubbing, or edema.  Peri [place]. Good capillary refill. Skin:  . No rashes, lesions, ulcers . palpation of skin: no induration or nodules Neurologic:  . Unable to evaluate as patient is unable to cooperate with exam. Psychiatric:  . Unable to evaluate as patient is unable to cooperate with exam.  I have personally reviewed the following:   Today's Data  . Vitals, CBC, BMP  Scheduled Meds: . aspirin  81 mg Oral Daily  . atenolol  25 mg Oral Daily  . colesevelam  1,875 mg Oral BID WC  . diltiazem  240 mg Oral Daily  . enoxaparin (LOVENOX) injection  40 mg Subcutaneous Q24H  . febuxostat  40 mg Oral BID  . ferrous sulfate  325 mg Oral Q breakfast  . levothyroxine  75 mcg Oral QAC breakfast  . losartan  100 mg Oral Daily  . mometasone-formoterol  2 puff Inhalation BID   Continuous Infusions: . lactated ringers Stopped (02/26/19 1546)  . methocarbamol (ROBAXIN) IV      Active Problems:   Closed fracture of left distal femur (HCC)   Hypothyroidism   Type 2 diabetes mellitus  without complication (HCC)   Normocytic anemia   Paroxysmal atrial fibrillation (HCC)   LOS: 3 days    A & P  Distal femur fracture: Acute.  Patient reports getting out of the pool when his leg went away and he heard a pop.  X-rays reveal a left distal spiralfemur fracture.  Orthopedics performed intramedullary nailing on 02/25/2019. The patient has tolerated the procedure well. Social work has been consulted. Admit to medical telemetry bed. Hip fracture order set utilized. PT/OT has evaluated the patient and has recommended    Anemia: Mixed etiology: Hemoglobin 11.0 g/dL preoperative is likely due to anemia of chronic disease. The drop in hemoglobin to 8.9 postoperatively is likely due to post op losses and dilutional effects. There are no reports of bleeding. Monitor.   Essential hypertension: Blood pressures well controlled on atenolol, diltiazem, and losartan which have been continued as at home. Monitor.   Paroxysmal atrial fibrillation: Patient with previous history of atrial fibrillation back in 10/2013.  Patient reports being on anticoagulation for a 1-2 weeks prior to undergoing cryoablation.  Since that time he has been on a daily aspirin and followed by cardiology.  Currently in sinus rhythm. Aspirin and diltiazem have been continued.   Coronary artery disease: Noted to be nonobstructive by cardiac cath in 2012. Continue aspirin, atenolol, and welchol.    Hypothyroidism: TSH 3.324 on 12/30/2018. Continue levothyroxine.   History of asthma: Patient without complaints of shortness of breath for all. CXR demonstrated no ative disease. Pharmacy substitution of Dulera. Albuterol nebs as needed for shortness of breath/wheezing.   Diabetes mellitus type 2: Patient appears well controlled on metformin.  Last hemoglobin A1c 5.8 on 12/30/2018. Metformin held while patient is inpatient. The patient is receiving a Heart healthy and carb modified diet. Continue gabapentin as needed for neuropathy.    Obesity: BMI 59.56 kg/m: Complicates all cares. Continue to counsel on need of weight loss.  I have seen and examined this patient myself. I have spent 30 minutes in his evaluation and care   DVT prophylaxis: lovenox  Code Status: Full Family Communication: Patient reports that he is able to update his wife Disposition Plan: Will likely need rehab in 2 to 3 days pending surgery  Alyson Ki, DO [facility] Direct contact: see [email or web address]  7PM-7AM contact night coverage as above 02/27/2019, 3:39 PM  LOS: 1 day

## 2019-02-27 NOTE — Progress Notes (Signed)
Physical Therapy Treatment Patient Details Name: Mark Pham MRN: [id number] DOB: [birth year] Today's Date: 02/27/2019    History of Present Illness Pt is a 73 y.o. male admitted 02/24/19 after fall sustaining L periprosthetic distal femur fx. S/p L femoral IM nail 6/27. PMH includes L TKA (2006), HTN, asthma.    PT Comments    Pt seen for functional mobility progression and gait trial.  Able to progress gait to 18 ft, but continues to fatigue quickly requiring seated rest break.  Pt requires min A for transfers and bed mobility. Pt able to maintain TDWB with transfers and short gait trial.  Pt remains very motivated to progress towards therapy goals.  Will continue to follow acutely.    Follow Up Recommendations  CIR;Supervision for mobility/OOB     Equipment Recommendations  Wheelchair (measurements PT);Wheelchair cushion (measurements PT)(TBD next venue)    Recommendations for Other Services       Precautions / Restrictions Precautions Precautions: Fall Restrictions Weight Bearing Restrictions: Yes LLE Weight Bearing: Touchdown weight bearing    Mobility  Bed Mobility Overal bed mobility: Needs Assistance Bed Mobility: Sit to Supine       Sit to supine: Min assist(for LLE into bed, RLE giving LLE some support)   General bed mobility comments: Increased time and effort  Transfers Overall transfer level: Needs assistance Equipment used: Rolling walker (2 wheeled) Transfers: Sit to/from Stand Sit to Stand: Min assist         General transfer comment: Pt able to maintain TDWB status. Min A required to power up to stand. no VCs required for sequencing.  However for safety patient will requires additional rehab.   Ambulation/Gait Ambulation/Gait assistance: Min assist Gait Distance (Feet): 17 Feet Assistive device: Rolling walker (2 wheeled)   Gait velocity: Decreased   General Gait Details: Pt able to perform hop-to gait on RLE with RW with minA for balance  and to steady RW. Pt quickly fatigued requiring seated rest   Stairs             Wheelchair Mobility    Modified Rankin (Stroke Patients Only)       Balance Overall balance assessment: Needs assistance Sitting-balance support: Single extremity supported;No upper extremity supported Sitting balance-Leahy Scale: Fair       Standing balance-Leahy Scale: Poor Standing balance comment: Reliant on UE support; unable to tolerate challenge                            Cognition Arousal/Alertness: Awake/alert Behavior During Therapy: WFL for tasks assessed/performed Overall Cognitive Status: Within Functional Limits for tasks assessed                                        Exercises General Exercises - Lower Extremity Ankle Circles/Pumps: Both;10 reps;Supine Long Arc Quad: AROM;Left;Right;10 reps;Seated Heel Slides: Left;Supine;AAROM(3 reps ) Hip ABduction/ADduction: Strengthening;Both;10 reps;Seated(pillow squeeze x 5 sec holds) Straight Leg Raises: Left;AAROM(3 reps) Hip Flexion/Marching: Seated;AROM;Left;Right;10 reps Other Exercises Other Exercises: seated hamstring stretch x 15 sec x 2 reps each leg    General Comments General comments (skin integrity, edema, etc.): spO2 90-94% on room air       Pertinent Vitals/Pain Pain Assessment: Faces Faces Pain Scale: Hurts a little bit Pain Location: LLE Pain Descriptors / Indicators: Guarding;Grimacing Pain Intervention(s): Limited activity within patient's tolerance;Monitored during session;Repositioned    Home  Living                      Prior Function            PT Goals (current goals can now be found in the care plan section) Acute Rehab PT Goals Patient Stated Goal: "I'll do as much as I can do get stronger" PT Goal Formulation: With patient Time For Goal Achievement: 03/12/19 Potential to Achieve Goals: Good Progress towards PT goals: Progressing toward goals     Frequency    Min 5X/week      PT Plan      Co-evaluation PT/OT/SLP Co-Evaluation/Treatment: Yes   PT goals addressed during session: Mobility/safety with mobility;Proper use of DME;Strengthening/ROM        AM-PAC PT "6 Clicks" Mobility   Outcome Measure  Help needed turning from your back to your side while in a flat bed without using bedrails?: None Help needed moving from lying on your back to sitting on the side of a flat bed without using bedrails?: None Help needed moving to and from a bed to a chair (including a wheelchair)?: A Lot Help needed standing up from a chair using your arms (e.g., wheelchair or bedside chair)?: A Lot Help needed to walk in hospital room?: A Little Help needed climbing 3-5 steps with a railing? : Total 6 Click Score: 16    End of Session Equipment Utilized During Treatment: Gait belt Activity Tolerance: Patient tolerated treatment well Patient left: in chair;with call bell/phone within reach Nurse Communication: Mobility status PT Visit Diagnosis: Other abnormalities of gait and mobility (R26.89);Pain Pain - Right/Left: Left Pain - part of body: Hip;Leg;Knee     Time: [id number] PT Time Calculation (min) (ACUTE ONLY): 25 min  Charges:  $Gait Training: 8-22 mins $Therapeutic Exercise: 8-22 mins                    Mark Pham, PTA Acute Rehab [phone]   Mark Pham,  L 02/27/2019, 2:08 PM

## 2019-02-27 NOTE — Progress Notes (Signed)
Mark Pham is a 73 y.o. male   Orthopaedic diagnosis: Left periprosthetic distal femur fracture status post IM nail on 02/25/19  Subjective: Mark Pham is doing well today.  He was able to work with physical therapy yesterday and maintain touchdown weightbearing with a walker.  Therapy has recommended inpatient rehab and he will be evaluated by them for appropriateness.  Denies any shortness of breath.  Pain is controlled.  No fevers or chills.  Objectyive: Vitals:   02/27/19 0816 02/27/19 0834  BP: (!) 156/86   Pulse: 61   Resp: 16   Temp: 98.2 F (36.8 C)   SpO2: 93% 92%     Exam: Awake and alert Respirations even and unlabored No acute distress  Left lower extremity with dressing in place.  No saturations of proximal dressing or knee dressing.  Patient has some swelling to leg and foot.  No tenderness to palpation at the calf.  Able to dorsiflex plantarflex the ankle.  Sensation tachycardia dorsal plantar foot.  Foot is warm and well-perfused.  Assessment: Status post IM nail, retrograde for left periprosthetic distal femur fracture   Plan: He will be evaluated by CIR for potential inpatient rehab placement. He is touchdown weightbearing to left lower extremity and will continue to work with physical therapy. Continue Lovenox and discharged with 2 weeks of Lovenox Pain control Appreciate hospitalist assistance with medical management. Okay for discharge from orthopedic standpoint once disposed established.  He will follow-up with me in 2 weeks for wound check and x-rays   Radene Journey, MD

## 2019-02-27 NOTE — Plan of Care (Signed)

## 2019-02-27 NOTE — Progress Notes (Signed)
RN attempted to update pt's wife, Manuela Schwartz, of pt status. Unsuccessful at this time. Will continue to monitor.

## 2019-02-27 NOTE — Progress Notes (Signed)
Inpatient Rehab Admissions:  Inpatient Rehab Consult received.  I met with pt at the bedside for rehabilitation assessment and to discuss goals and expectations of an inpatient rehab admission.  Pt would like to pursue CIR at this time. AC will begin insurance authorization process for possible admit.   Jhonnie Garner, OTR/L  Rehab Admissions Coordinator  [phone] 02/27/2019 6:45 PM

## 2019-02-27 NOTE — Progress Notes (Signed)
Occupational Therapy Evaluation Patient Details Name: Mark Pham MRN: [id number] DOB: Sep 11, 1945 Today's Date: 02/27/2019    History of Present Illness Pt is a 73 y.o. male admitted 02/24/19 after fall sustaining L periprosthetic distal femur fx. S/p L femoral IM nail 6/27. PMH includes L TKA (2006), HTN, asthma.   Clinical Impression   Pt presented with above diagnosis. PTA pt PLOF mostly independent in all ADLs and IADLs requiring minA in LB ADLs. Pt currently requires Mod I bed mobility with use of bed rails, Min A LLE don/doff of sock, and Min A sit to stand transisition, Min guard functional transfers. Pt able to maintain TDWB with no cues required. Pt will benefit from continued acute OT to address deficits prior to d/c to CIR to maximize independence in ADLs. OT will continue to follow acutely.     Follow Up Recommendations  CIR;Supervision/Assistance - 24 hour    Equipment Recommendations       Recommendations for Other Services Rehab consult     Precautions / Restrictions Precautions Precautions: Fall Restrictions Weight Bearing Restrictions: Yes LLE Weight Bearing: Touchdown weight bearing      Mobility Bed Mobility Overal bed mobility: Needs Assistance Bed Mobility: Supine to Sit     Supine to sit: HOB elevated;Modified independent (Device/Increase time)     General bed mobility comments: Increased time and effort, reliant on UE support  Transfers Overall transfer level: Needs assistance Equipment used: Rolling walker (2 wheeled) Transfers: Sit to/from Stand Sit to Stand: Min assist         General transfer comment: Pt able to maintain TDWB status. Min A required to power up to stand. Pt able to demonstrate learned technique from prior PT session. no VCs required for sequencing.  However for safety patient will requires additional rehab.     Balance Overall balance assessment: Needs assistance Sitting-balance support: Single extremity supported;No  upper extremity supported Sitting balance-Leahy Scale: Fair       Standing balance-Leahy Scale: Poor Standing balance comment: Reliant on UE support; unable to tolerate challenge                           ADL either performed or assessed with clinical judgement   ADL Overall ADL's : Needs assistance/impaired Eating/Feeding: Independent;Sitting   Grooming: Wash/dry hands;Wash/dry face;Oral care;Independent;Sitting   Upper Body Bathing: Independent;Sitting   Lower Body Bathing: Modified independent;Sitting/lateral leans   Upper Body Dressing : Independent;Sitting   Lower Body Dressing: Minimal assistance;Sit to/from stand Lower Body Dressing Details (indicate cue type and reason): requires assist to don/doff LLE sock Toilet Transfer: Minimal assistance;Min guard Toilet Transfer Details (indicate cue type and reason): simulated toilet transfer from bed to recliner. Min A to power up to stand with RW and min guard for safety with functional mobility.         Functional mobility during ADLs: Min guard;Minimal assistance General ADL Comments: BP: supine 135/81, sitting EOB 130/74     Vision         Perception     Praxis      Pertinent Vitals/Pain Pain Assessment: Faces Faces Pain Scale: Hurts a little bit Pain Location: LLE Pain Descriptors / Indicators: Guarding;Grimacing Pain Intervention(s): Monitored during session;Repositioned     Hand Dominance Right   Extremity/Trunk Assessment Upper Extremity Assessment Upper Extremity Assessment: Overall WFL for tasks assessed   Lower Extremity Assessment Lower Extremity Assessment: Defer to PT evaluation LLE Deficits / Details: s/p L femoral IM  nail; knee flexion/ext 3/5, hip flex < 3/5 LLE: Unable to fully assess due to pain LLE Coordination: decreased gross motor       Communication Communication Communication: HOH   Cognition Arousal/Alertness: Awake/alert Behavior During Therapy: WFL for tasks  assessed/performed Overall Cognitive Status: Within Functional Limits for tasks assessed                                     General Comments  SpO2 93% on RA    Exercises     Shoulder Instructions      Home Living Family/patient expects to be discharged to:: Private residence Living Arrangements: Spouse/significant other;Children Available Help at Discharge: Family;Available 24 hours/day Type of Home: House Home Access: Stairs to enter [employer] of Steps: 3 Entrance Stairs-Rails: Right;Left;Can reach both Home Layout: One level               Home Equipment: Walker - 2 wheels;Walker - 4 wheels;Bedside commode   Additional Comments: Lives with wife and son. Wife with baseline rotator cuff issues, but son in good physical health and able to provide assist      Prior Functioning/Environment Level of Independence: Independent        Comments: Retired Holiday representative. Enjoys being active with grandchildren, involved in church and mission trips        OT Problem List:        OT Treatment/Interventions: Self-care/ADL training;Therapeutic exercise;Therapeutic activities;Patient/family education;Balance training    OT Goals(Current goals can be found in the care plan section) Acute Rehab OT Goals Patient Stated Goal: "I'll do as much as I can do get stronger" OT Goal Formulation: With patient Time For Goal Achievement: 03/13/19 Potential to Achieve Goals: Good  OT Frequency: Min 2X/week   Barriers to D/C: Decreased caregiver support  Wife is unable to assist as needed due to her conditions       Co-evaluation              AM-PAC OT "6 Clicks" Daily Activity     Outcome Measure Help from another person eating meals?: None Help from another person taking care of personal grooming?: None Help from another person toileting, which includes using toliet, bedpan, or urinal?: A Little Help from another person bathing (including  washing, rinsing, drying)?: A Little Help from another person to put on and taking off regular upper body clothing?: None Help from another person to put on and taking off regular lower body clothing?: A Little 6 Click Score: 21   End of Session Equipment Utilized During Treatment: Gait belt;Rolling walker Nurse Communication: Mobility status;Weight bearing status  Activity Tolerance: Patient tolerated treatment well Patient left: in chair;with call bell/phone within reach  OT Visit Diagnosis: Unsteadiness on feet (R26.81);Pain Pain - Right/Left: Left                Time: 8676-1950 OT Time Calculation (min): 20 min Charges:  OT General Charges $OT Visit: 1 Visit OT Evaluation $OT Eval Low Complexity: [place], MSOT, OTR/L  Supplemental Rehabilitation Services  [phone]   Marius Ditch 02/27/2019, 12:23 PM

## 2019-02-28 NOTE — Progress Notes (Signed)
Inpatient Rehabilitation-Admissions Coordinator   [facility] was notified by pt's insurance company that his request for CIR was denied. Pt aware of denial. AC spoke with Dr. Benny Lennert who feels SNF is appropriate. AC will contact SW regarding new dispo needs and will sign off.   Please call if questions.   Mark Pham, OTR/L  Rehab Admissions Coordinator  [phone] 02/28/2019 2:06 PM

## 2019-02-28 NOTE — Progress Notes (Signed)
RN called and updated pt's wife, Manuela Schwartz, of pt status. All questions answered to satisfaction. Will continue to monitor.

## 2019-02-28 NOTE — Plan of Care (Signed)

## 2019-02-28 NOTE — Progress Notes (Signed)
PROGRESS NOTE  Mark LoanKenneth Pham WUJ:[id number] DOB: [birth year] DOA: 02/24/2019 PCP: Bailey MechPodraza, Cole Christopher, PA-C  Brief History   Mark LoanKenneth Pham is a 73 y.o. male with medical history significant of HTN, PAF s/p cryoablation 10/2013 on aspirin, HLD, CAD (nonobstructive by cath in 2012), asthma, and hypothyroidism; who presents with complaints of left leg pain.  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  He reports that knee went outward and he heard a pop.  He fell back into the pool to avoid falling on the concrete.  Denies any loss of consciousness or trauma to his head.  He complained of having severe pain and saw the deformity in his leg.  Denies any break in the skin, but it is on the same side that he has had previous knee replacement.  It is symptoms include a burning tingling sensation down the left leg.     In route with EMS patient was given 150 mcg of fentanyl and 4 mg of Zofran.   ED Course: Upon admission into the emergency department patient was noted to be afebrile, pulse 53-58, blood pressure elevated up to 185/86, and all other vital signs maintained.  Labs revealed WBC 8.3 and hemoglobin 12.2.  X-rays of left femur revealed a spiral type distal fracture.  COVID-19 screening negative.  Orthopedics consulted and plan on likely surgical procedure in a.m.  The patient was admitted to a medical bed. Orthopedic surgery was consulted and the patient underwent an intramedullary nail of the left periprostetic distal femur. The patient has tolerated the procedure well. PTOT has made recommendations that the patient go to inpatient rehab. They have been consulted to evaluate the patient. Unfortunaely insurance has denied inpatient rehab. He will be evaluated for SNF. Consultants  . Orthopedic surgery  Procedures  . Intramedullary Nailing  Antibiotics   Anti-infectives (From admission, onward)   Start     Dose/Rate Route  Frequency Ordered Stop   02/25/19 1700  ceFAZolin (ANCEF) IVPB 2g/100 mL premix     2 g 200 mL/hr over 30 Minutes Intravenous Every 6 hours 02/25/19 1353 02/26/19 0511   02/25/19 0600  ceFAZolin (ANCEF) IVPB 2g/100 mL premix     2 g 200 mL/hr over 30 Minutes Intravenous On call to O.R. 02/24/19 1846 02/25/19 1103      Subjective  The patient is resting comfortably. No new complaints.  Objective   Vitals:  Vitals:   02/28/19 1349 02/28/19 1351  BP: 139/78 (!) 154/83  Pulse: 62 (!) 57  Resp:    Temp:    SpO2: 100% 100%   Exam:  Constitutional:       The patient is awake, alert, and oriented x 2.  No acute distress. Respiratory:  . CTA bilaterally, no w/r/r.  . No tactile fremitus . No increased work of breathing. Cardiovascular:  . Regular rate and rhythm. . No murmurs, ectopy, or gallups. . No lateral PMI. No thrills. Abdomen:  . Abdomen is soft, non-tender, non-distended. . No hernias, masses, or organomegaly. . Normoactive bowel sounds Musculoskeletal:  . No cyanosis, clubbing, or edema.  Peri [place]. Good capillary refill. Skin:  . No rashes, lesions, ulcers . palpation of skin: no induration or nodules Neurologic:  . Unable to evaluate as patient is unable to cooperate with exam. Psychiatric:  . Unable to evaluate as patient is unable to cooperate with exam.  I have personally reviewed the following:   Today's Data  .  Vitals, CBC, BMP  Scheduled Meds: . aspirin  81 mg Oral Daily  . atenolol  25 mg Oral Daily  . colesevelam  1,875 mg Oral BID WC  . diltiazem  240 mg Oral Daily  . enoxaparin (LOVENOX) injection  40 mg Subcutaneous Q24H  . febuxostat  40 mg Oral BID  . ferrous sulfate  325 mg Oral Q breakfast  . levothyroxine  75 mcg Oral QAC breakfast  . losartan  100 mg Oral Daily  . mometasone-formoterol  2 puff Inhalation BID   Continuous Infusions: . lactated ringers Stopped (02/26/19 1546)  . methocarbamol (ROBAXIN) IV      Active Problems:    Closed fracture of left distal femur (HCC)   Hypothyroidism   Type 2 diabetes mellitus without complication (HCC)   Normocytic anemia   Paroxysmal atrial fibrillation (HCC)   LOS: 4 days    A & P  Distal femur fracture: Acute.  Patient reports getting out of the pool when his leg went away and he heard a pop.  X-rays reveal a left distal spiralfemur fracture.  Orthopedics performed intramedullary nailing on 02/25/2019. The patient has tolerated the procedure well. Social work has been consulted. Admit to medical telemetry bed. Hip fracture order set utilized. PT/OT has evaluated the patient and has recommended inpatient rehab. However insurance has declined. SW is seeking SNF placement.   Anemia: Mixed etiology: Hemoglobin 11.0 g/dL preoperative is likely due to anemia of chronic disease. The drop in hemoglobin to 8.9 postoperatively is likely due to post op losses and dilutional effects. There are no reports of bleeding. Monitor.   Essential hypertension: Blood pressures well controlled on atenolol, diltiazem, and losartan which have been continued as at home. Monitor.   Paroxysmal atrial fibrillation: Patient with previous history of atrial fibrillation back in 10/2013.  Patient reports being on anticoagulation for a 1-2 weeks prior to undergoing cryoablation.  Since that time he has been on a daily aspirin and followed by cardiology.  Currently in sinus rhythm. Aspirin and diltiazem have been continued.   Coronary artery disease: Noted to be nonobstructive by cardiac cath in 2012. Continue aspirin, atenolol, and welchol.    Hypothyroidism: TSH 3.324 on 12/30/2018. Continue levothyroxine.   History of asthma: Patient without complaints of shortness of breath for all. CXR demonstrated no ative disease. Pharmacy substitution of Dulera. Albuterol nebs as needed for shortness of breath/wheezing.   Diabetes mellitus type 2: Patient appears well controlled on metformin.  Last hemoglobin A1c 5.8 on  12/30/2018. Metformin held while patient is inpatient. The patient is receiving a Heart healthy and carb modified diet. Continue gabapentin as needed for neuropathy.   Obesity: BMI 67.34 kg/m: Complicates all cares. Continue to counsel on need of weight loss.  I have seen and examined this patient myself. I have spent 32 minutes in his evaluation and care   DVT prophylaxis: lovenox  Code Status: Full Family Communication: Patient reports that he is able to update his wife Disposition Plan: Will likely need rehab in 2 to 3 days pending surgery  Mark Prettyman, DO [facility] Direct contact: see [email or web address]  7PM-7AM contact night coverage as above 02/28/2019, 2:03 PM  LOS: 1 day

## 2019-02-28 NOTE — Progress Notes (Signed)
Physical Therapy Treatment Patient Details Name: Mark Pham MRN: [id number] DOB: Feb 03, 1946 Today's Date: 02/28/2019    History of Present Illness Pt is a 73 y.o. male admitted 02/24/19 after fall sustaining L periprosthetic distal femur fx. S/p L femoral IM nail 6/27. PMH includes L TKA (2006), HTN, asthma.    PT Comments    Pt performed gait training around 9 ft before feeling faint, he had difficulty walking back to recliner and maintaining weight bearing status.  Pt returned to chair and placed in reclined position due to feeling faint.  Attempted orthostatic vitals but patient symptomatic once again and unable to obtain readings.  CIR denying placement, will defer to SNF as he continues to require rehab to improve strength and function before returning home.  Will inform supervising PT of need for change in recommendations at this time.      Follow Up Recommendations  Supervision for mobility/OOB;SNF     Equipment Recommendations  Wheelchair (measurements PT);Wheelchair cushion (measurements PT)    Recommendations for Other Services       Precautions / Restrictions Precautions Precautions: Fall Restrictions Weight Bearing Restrictions: Yes LLE Weight Bearing: Touchdown weight bearing    Mobility  Bed Mobility Overal bed mobility: Needs Assistance             General bed mobility comments: Pt seated in recliner on arrival.  Transfers Overall transfer level: Needs assistance Equipment used: Rolling walker (2 wheeled) Transfers: Sit to/from Stand Sit to Stand: Min assist         General transfer comment: Cues for hand placement and min assistance to boost into standing.  performed x2 reps.  Poor eccentric loading returning to seared surface.  Ambulation/Gait Ambulation/Gait assistance: Min assist;Mod assist(Initially min became mod as he started to feel faint) Gait Distance (Feet): 15 Feet Assistive device: Rolling walker (2 wheeled) Gait  Pattern/deviations: Step-to pattern;Trunk flexed Gait velocity: Decreased   General Gait Details: Perfoming mostly hop to pattern,  As patient turned around at door way to head back to recliner he became faint and unable to maintain weight bearing for end of trial.  Obtained BP 139/78, unable to obtain orthostatics as he is unable to stand long enough for standing BP before feeling faint again.   Stairs             Wheelchair Mobility    Modified Rankin (Stroke Patients Only)       Balance Overall balance assessment: Needs assistance Sitting-balance support: Single extremity supported;No upper extremity supported Sitting balance-Leahy Scale: Fair       Standing balance-Leahy Scale: Poor                              Cognition Arousal/Alertness: Awake/alert Behavior During Therapy: WFL for tasks assessed/performed;Anxious Overall Cognitive Status: Within Functional Limits for tasks assessed                                        Exercises General Exercises - Lower Extremity Ankle Circles/Pumps: Both;Supine;20 reps;AROM Quad Sets: AROM;Right;10 reps;Supine Heel Slides: AAROM;Right;10 reps;Supine Hip ABduction/ADduction: AAROM;Right;10 reps;Supine    General Comments        Pertinent Vitals/Pain Pain Assessment: 0-10 Pain Score: 1 (initially reports 7/10) Pain Location: LLE Pain Descriptors / Indicators: Guarding;Grimacing Pain Intervention(s): Monitored during session;Repositioned    Home Living  Prior Function            PT Goals (current goals can now be found in the care plan section) Acute Rehab PT Goals Patient Stated Goal: "I'll do as much as I can do get stronger" PT Goal Formulation: With patient Potential to Achieve Goals: Good Progress towards PT goals: Progressing toward goals    Frequency    Min 5X/week      PT Plan Discharge plan needs to be updated    Co-evaluation               AM-PAC PT "6 Clicks" Mobility   Outcome Measure  Help needed turning from your back to your side while in a flat bed without using bedrails?: None Help needed moving from lying on your back to sitting on the side of a flat bed without using bedrails?: None Help needed moving to and from a bed to a chair (including a wheelchair)?: A Little Help needed standing up from a chair using your arms (e.g., wheelchair or bedside chair)?: A Little Help needed to walk in hospital room?: A Lot Help needed climbing 3-5 steps with a railing? : A Lot 6 Click Score: 18    End of Session Equipment Utilized During Treatment: Gait belt Activity Tolerance: Patient tolerated treatment well Patient left: in chair;with call bell/phone within reach Nurse Communication: Mobility status PT Visit Diagnosis: Other abnormalities of gait and mobility (R26.89);Pain Pain - Right/Left: Left Pain - part of body: Hip;Leg;Knee     Time: 8299-3716 PT Time Calculation (min) (ACUTE ONLY): 23 min  Charges:  $Gait Training: 8-22 mins $Therapeutic Exercise: 8-22 mins                     Governor Rooks, PTA Acute Rehabilitation Services Pager [phone] Office [phone]     Mark Pham 02/28/2019, 2:04 PM

## 2019-03-01 LAB — URINALYSIS, ROUTINE W REFLEX MICROSCOPIC
Bilirubin Urine: NEGATIVE
Glucose, UA: NEGATIVE mg/dL
Ketones, ur: 20 mg/dL — AB
Nitrite: NEGATIVE
Protein, ur: 30 mg/dL — AB
Specific Gravity, Urine: 1.014 (ref 1.005–1.030)
pH: 7 (ref 5.0–8.0)

## 2019-03-01 LAB — SARS CORONAVIRUS 2 BY RT PCR (HOSPITAL ORDER, PERFORMED IN ~~LOC~~ HOSPITAL LAB): SARS Coronavirus 2: NEGATIVE

## 2019-03-01 MED ORDER — SODIUM CHLORIDE 0.9 % IV BOLUS
1000.0000 mL | Freq: Once | INTRAVENOUS | Status: AC
  Administered 2019-03-01: 1000 mL via INTRAVENOUS

## 2019-03-01 NOTE — Care Management Important Message (Signed)
Important Message  Patient Details  Name: Mark Pham MRN: [id number] Date of Birth: 10/12/1945   Medicare Important Message Given:  Yes     Memory Argue 03/01/2019, 4:12 PM    SIGNED

## 2019-03-01 NOTE — NC FL2 (Signed)
[place] LEVEL OF CARE SCREENING TOOL     IDENTIFICATION  Patient Name: Mark Pham Birthdate: March 27, 1946 Sex: male Admission Date (Current Location): 02/24/2019  [facility] and [place] Number:  Herbalist and Address:  [facility] [place], [place] [address], [place], [place] [address]      Provider Number: [id number]  Attending Physician Name and Address:  Karie Kirks, DO  Relative Name and Phone Number:  Manuela Schwartz ([phone]    Current Level of Care: Hospital Recommended Level of Care: [place] Prior Approval Number:    Date Approved/Denied:   PASRR Number: [id number]  Discharge Plan: SNF    Current Diagnoses: Patient Active Problem List   Diagnosis Date Noted  . Closed fracture of left distal femur ([place]) 02/24/2019  . Hypothyroidism 02/24/2019  . Type 2 diabetes mellitus without complication ([place]) 93/71/6967  . Normocytic anemia 02/24/2019  . Paroxysmal atrial fibrillation (HCC) 02/24/2019    Orientation RESPIRATION BLADDER Height & Weight     Self, Time, Situation, Place  Normal Continent Weight: 230 lb (104.3 kg) Height:  5' 11.5" (181.6 cm)  BEHAVIORAL SYMPTOMS/MOOD NEUROLOGICAL BOWEL NUTRITION STATUS      Continent Diet(see discharge summary)  AMBULATORY STATUS COMMUNICATION OF NEEDS Skin   Limited Assist Verbally Other (Comment)(left leg closed surgical incision)                       Personal Care Assistance Level of Assistance  Bathing, Feeding, Dressing, Total care Bathing Assistance: Limited assistance Feeding assistance: Independent Dressing Assistance: Limited assistance Total Care Assistance: Limited assistance   Functional Limitations Info  Sight, Hearing, Speech Sight Info: Adequate Hearing Info: Adequate Speech Info: Adequate    SPECIAL CARE FACTORS FREQUENCY  PT (By licensed PT), OT (By licensed OT)     PT Frequency: min 5x weekly OT Frequency: min 5x weekly             Contractures Contractures Info: Not present    Additional Factors Info  Code Status, Allergies Code Status Info: full Allergies Info: Ace Inhibitors Statins Latanoprost Allopurinol           Current Medications (03/01/2019):  This is the current hospital active medication list Current Facility-Administered Medications  Medication Dose Route Frequency Provider Last Rate Last Dose  . albuterol (PROVENTIL) (2.5 MG/3ML) 0.083% nebulizer solution 2.5 mg  2.5 mg Nebulization Q6H PRN Erle Crocker, MD      . aspirin chewable tablet 81 mg  81 mg Oral Daily Erle Crocker, MD   81 mg at 03/01/19 0856  . atenolol (TENORMIN) tablet 25 mg  25 mg Oral Daily Erle Crocker, MD   25 mg at 03/01/19 0855  . colesevelam [facility]) tablet 1,875 mg  1,875 mg Oral BID WC Erle Crocker, MD   1,875 mg at 03/01/19 0855  . diltiazem (CARDIZEM CD) 24 hr capsule 240 mg  240 mg Oral Daily Erle Crocker, MD   240 mg at 03/01/19 0855  . enoxaparin (LOVENOX) injection 40 mg  40 mg Subcutaneous Q24H Erle Crocker, MD   40 mg at 02/28/19 1941  . febuxostat (ULORIC) tablet 40 mg  40 mg Oral BID Erle Crocker, MD   40 mg at 03/01/19 0856  . ferrous sulfate tablet 325 mg  325 mg Oral Q breakfast Erle Crocker, MD   325 mg at 03/01/19 0840  . gabapentin (NEURONTIN) capsule 300 mg  300 mg  Oral BID PRN Terance HartAdair, Christopher R, MD      . hydrALAZINE (APRESOLINE) injection 10 mg  10 mg Intravenous Q4H PRN Terance HartAdair, Christopher R, MD      . HYDROcodone-acetaminophen (NORCO/VICODIN) 5-325 MG per tablet 1-2 tablet  1-2 tablet Oral Q6H PRN Terance HartAdair, Christopher R, MD   2 tablet at 03/01/19 0143  . lactated ringers infusion   Intravenous Continuous Terance HartAdair, Christopher R, MD   Stopped at 02/26/19 1546  . levothyroxine (SYNTHROID) tablet 75 mcg  75 mcg Oral QAC breakfast Terance HartAdair, Christopher R, MD   75 mcg at 03/01/19 0558  . losartan (COZAAR) tablet 100 mg  100 mg Oral Daily Terance HartAdair,  Christopher R, MD   100 mg at 03/01/19 0856  . methocarbamol (ROBAXIN) tablet 500 mg  500 mg Oral Q6H PRN Terance HartAdair, Christopher R, MD   500 mg at 02/27/19 2215   Or  . methocarbamol (ROBAXIN) 500 mg in dextrose 5 % 50 mL IVPB  500 mg Intravenous Q6H PRN Terance HartAdair, Christopher R, MD      . metoCLOPramide (REGLAN) tablet 5-10 mg  5-10 mg Oral Q8H PRN Terance HartAdair, Christopher R, MD       Or  . metoCLOPramide (REGLAN) injection 5-10 mg  5-10 mg Intravenous Q8H PRN Terance HartAdair, Christopher R, MD      . mometasone-formoterol [facility](DULERA) 200-5 MCG/ACT inhaler 2 puff  2 puff Inhalation BID Terance HartAdair, Christopher R, MD   2 puff at 03/01/19 [phone]  . morphine 2 MG/ML injection 0.5 mg  0.5 mg Intravenous Q2H PRN Terance HartAdair, Christopher R, MD   0.5 mg at 02/25/19 0853  . ondansetron (ZOFRAN) injection 4 mg  4 mg Intravenous Q6H PRN Terance HartAdair, Christopher R, MD   4 mg at 02/24/19 1819     Discharge Medications: Please see discharge summary for a list of discharge medications.  Relevant Imaging Results:  Relevant Lab Results:   Additional Information SSN: [id number]  Gildardo GriffesAshley M Dotti Busey, LCSW

## 2019-03-01 NOTE — TOC Initial Note (Signed)
Transition of Care [facility](TOC) - Initial/Assessment Note    Patient Details  Name: Mark LoanKenneth Peretz MRN: [id number] Date of Birth: [birth year]  Transition of Care [facility](TOC) CM/SW Contact:    Gildardo Griffesshley M Milly Goggins, [place] Phone Number: [phone] 03/01/2019, 2:32 PM  Clinical Narrative:                  CSW consulted with patient regarding SNF rec. Patient slightly discouraged with CIR denial, [facility], CSW will fax out referrals for bed offers. Patient informed on insurance authorization process and expressed understanding.   Expected Discharge Plan: Skilled Nursing Facility Barriers to Discharge: Continued Medical Work up   Patient Goals and CMS Choice Patient states their goals for this hospitalization and ongoing recovery are:: to go to rehab then home CMS Medicare.gov Compare Post Acute Care list provided to:: Patient Choice offered to / list presented to : Patient  Expected Discharge Plan and Services Expected Discharge Plan: Skilled Nursing Facility     Post Acute Care Choice: Skilled Nursing Facility Living arrangements for the past 2 months: Single Family Home                                      Prior Living Arrangements/Services Living arrangements for the past 2 months: Single Family Home Lives with:: Spouse Patient language and need for interpreter reviewed:: Yes Do you feel safe going back to the place where you live?: No   needs short term rehab before returning  Need for Family Participation in Patient Care: No (Comment) Care giver support system in place?: Yes (comment)   Criminal Activity/Legal Involvement Pertinent to Current Situation/Hospitalization: No - Comment as needed  Activities of Daily Living Home Assistive Devices/Equipment: None ADL Screening (condition at time of admission) Patient's cognitive ability adequate to safely complete daily activities?: Yes Is the patient deaf or have difficulty hearing?: No Does  the patient have difficulty seeing, even when wearing glasses/contacts?: No Does the patient have difficulty concentrating, remembering, or making decisions?: No Patient able to express need for assistance with ADLs?: Yes Does the patient have difficulty dressing or bathing?: No Independently performs ADLs?: Yes (appropriate for developmental age) Does the patient have difficulty walking or climbing stairs?: Yes Weakness of Legs: Left Weakness of Arms/Hands: None  Permission Sought/Granted Permission sought to share information with : Case Manager, Magazine features editoracility Contact Representative, Family Supports Permission granted to share information with : Yes, Verbal Permission Granted  Share Information with NAME: Darl PikesSusan  Permission granted to share info w AGENCY: SNFs  Permission granted to share info w Relationship: spouse  Permission granted to share info w Contact Information: [phone]  Emotional Assessment Appearance:: Appears stated age Attitude/Demeanor/Rapport: Gracious Affect (typically observed): Calm Orientation: : Oriented to Self, Oriented to Place, Oriented to  Time, Oriented to Situation Alcohol / Substance Use: Not Applicable Psych Involvement: No (comment)  Admission diagnosis:  SOB (shortness of breath) [R06.02] Closed fracture of left femur (HCC) [S72.92XA] Patient Active Problem List   Diagnosis Date Noted  . Closed fracture of left distal femur (HCC) 02/24/2019  . Hypothyroidism 02/24/2019  . Type 2 diabetes mellitus without complication (HCC) 02/24/2019  . Normocytic anemia 02/24/2019  . Paroxysmal atrial fibrillation (HCC) 02/24/2019   PCP:  Bailey MechPodraza, Cole Christopher, PA-C [facility]   [id number] [place], [place] - [address]  [address] [place] [address] Phone: [phone] Fax: [phone]     Social Determinants of Health (SDOH) Interventions    Readmission Risk Interventions No  flowsheet data found.

## 2019-03-01 NOTE — Progress Notes (Signed)
PROGRESS NOTE  Mark LoanKenneth Pham ZOX:[id number] DOB: Sep 27, 1945 DOA: 02/24/2019 PCP: Bailey MechPodraza, Mark Christopher, PA-C  Brief History   Mark Pham is a 73 y.o. male with medical history significant of HTN, PAF s/p cryoablation 10/2013 on aspirin, HLD, CAD (nonobstructive by cath in 2012), asthma, and hypothyroidism; who presents with complaints of left leg pain.  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  He reports that knee went outward and he heard a pop.  He fell back into the pool to avoid falling on the concrete.  Denies any loss of consciousness or trauma to his head.  He complained of having severe pain and saw the deformity in his leg.  Denies any break in the skin, but it is on the same side that he has had previous knee replacement.  It is symptoms include a burning tingling sensation down the left leg.     In route with EMS patient was given 150 mcg of fentanyl and 4 mg of Zofran.   ED Course: Upon admission into the emergency department patient was noted to be afebrile, pulse 53-58, blood pressure elevated up to 185/86, and all other vital signs maintained.  Labs revealed WBC 8.3 and hemoglobin 12.2.  X-rays of left femur revealed a spiral type distal fracture.  COVID-19 screening negative.  Orthopedics consulted and plan on likely surgical procedure in a.m.  The patient was admitted to a medical bed. Orthopedic surgery was consulted and the patient underwent an intramedullary nail of the left periprostetic distal femur. The patient has tolerated the procedure well. PTOT has made recommendations that the patient go to inpatient rehab. They have been consulted to evaluate the patient. Unfortunaely insurance has denied inpatient rehab. He will be evaluated for SNF. Consultants  . Orthopedic surgery  Procedures  . Intramedullary Nailing  Antibiotics   Anti-infectives (From admission, onward)   Start     Dose/Rate Route  Frequency Ordered Stop   02/25/19 1700  ceFAZolin (ANCEF) IVPB 2g/100 mL premix     2 g 200 mL/hr over 30 Minutes Intravenous Every 6 hours 02/25/19 1353 02/26/19 0511   02/25/19 0600  ceFAZolin (ANCEF) IVPB 2g/100 mL premix     2 g 200 mL/hr over 30 Minutes Intravenous On call to O.R. 02/24/19 1846 02/25/19 1103      Subjective  The patient is reporting feeling dizzy today. His urine is dark, and his orthostatics are positive.  Objective   Vitals:  Vitals:   03/01/19 0558 03/01/19 0852  BP: (!) 147/79 (!) 159/77  Pulse: 60 62  Resp: 20 20  Temp: 97.6 F (36.4 C) 98 F (36.7 C)  SpO2: 95% 98%   Exam:  Constitutional:       The patient is awake, alert, and oriented x 2.  He states that he is feeling dizzy today.  Respiratory:  . No increased work of breathing. . No wheezes, rales, or rhonchi . No  Tactile fremitus Cardiovascular:  . Regular rate and rhythm. . No murmurs, ectopy, or gallups. . No lateral PMI. No thrills. Abdomen:  . Abdomen is soft, non-tender, non-distended. . No hernias, masses, or organomegaly. . Normoactive bowel sounds Musculoskeletal:  . No cyanosis, clubbing, or edema.  Peri [place]. Good capillary refill. Skin:  . No rashes, lesions, ulcers . palpation of skin: no induration or nodules  I have personally reviewed the following:   Today's Data  . Vitals  Scheduled Meds: .  aspirin  81 mg Oral Daily  . atenolol  25 mg Oral Daily  . colesevelam  1,875 mg Oral BID WC  . diltiazem  240 mg Oral Daily  . enoxaparin (LOVENOX) injection  40 mg Subcutaneous Q24H  . febuxostat  40 mg Oral BID  . ferrous sulfate  325 mg Oral Q breakfast  . levothyroxine  75 mcg Oral QAC breakfast  . losartan  100 mg Oral Daily  . mometasone-formoterol  2 puff Inhalation BID   Continuous Infusions: . lactated ringers Stopped (02/26/19 1546)  . methocarbamol (ROBAXIN) IV      Active Problems:   Closed fracture of left distal femur (HCC)   Hypothyroidism   Type 2  diabetes mellitus without complication (HCC)   Normocytic anemia   Paroxysmal atrial fibrillation (HCC)   LOS: 5 days    A & P  Distal femur fracture: Acute.  Patient reports getting out of the pool when his leg went away and he heard a pop.  X-rays reveal a left distal spiralfemur fracture.  Orthopedics performed intramedullary nailing on 02/25/2019. The patient has tolerated the procedure well. Social work has been consulted. Admit to medical telemetry bed. Hip fracture order set utilized. PT/OT has evaluated the patient and has recommended inpatient rehab. However insurance has declined. SW is seeking SNF placement.  Orthostatic hypotension: The patient will be bolused with 1L NS.    Anemia: Mixed etiology: Hemoglobin 11.0 g/dL preoperative is likely due to anemia of chronic disease. The drop in hemoglobin to 8.9 postoperatively is likely due to post op losses and dilutional effects. There are no reports of bleeding. Monitor.   Essential hypertension: Blood pressures well controlled on atenolol, diltiazem, and losartan which have been continued as at home. Monitor.   Paroxysmal atrial fibrillation: Patient with previous history of atrial fibrillation back in 10/2013.  Patient reports being on anticoagulation for a 1-2 weeks prior to undergoing cryoablation.  Since that time he has been on a daily aspirin and followed by cardiology.  Currently in sinus rhythm. Aspirin and diltiazem have been continued.   Coronary artery disease: Noted to be nonobstructive by cardiac cath in 2012. Continue aspirin, atenolol, and welchol.    Hypothyroidism: TSH 3.324 on 12/30/2018. Continue levothyroxine.   History of asthma: Patient without complaints of shortness of breath for all. CXR demonstrated no ative disease. Pharmacy substitution of Dulera. Albuterol nebs as needed for shortness of breath/wheezing.   Diabetes mellitus type 2: Patient appears well controlled on metformin.  Last hemoglobin A1c 5.8 on  12/30/2018. Metformin held while patient is inpatient. The patient is receiving a Heart healthy and carb modified diet. Continue gabapentin as needed for neuropathy.   Obesity: BMI 64.40 kg/m: Complicates all cares. Continue to counsel on need of weight loss.  I have seen and examined this patient myself. I have spent 30 minutes in his evaluation and care.   DVT prophylaxis: lovenox  Code Status: Full Family Communication: Patient reports that he is able to update his wife Disposition Plan: Will likely need rehab in 2 to 3 days pending surgery  Lukisha Procida, DO [facility] Direct contact: see [email or web address]  7PM-7AM contact night coverage as above 03/01/2019, 2:03 PM  LOS: 1 day

## 2019-03-01 NOTE — Progress Notes (Signed)
Physical Therapy Treatment Patient Details Name: Mark Pham MRN: [id number] DOB: 1945/12/12 Today's Date: 03/01/2019    History of Present Illness Pt is a 73 y.o. male admitted 02/24/19 after fall sustaining L periprosthetic distal femur fx. S/p L femoral IM nail 6/27. PMH includes L TKA (2006), HTN, asthma.    PT Comments    Pt performed functional mobility with assistance of +2 for safety due to symptomtic pre syncopal episode yesterday.  PTA obtained orthostatic vitals and patient presents today with symptomatic response.  Pt limited to bed to chair based on presentation.  He was able to maintain weight bearing for 3 steps from bed to recliner.  Pt asked PTA to empty urinal.  His urine is dark amber ( tea colored ) with sediment.  Pt resting in recliner and fluids encouraged.    BP orthostatics: 182/91 lying 156/105 sitting 111/91 standing   Follow Up Recommendations  Supervision for mobility/OOB;SNF     Equipment Recommendations  Wheelchair (measurements PT);Wheelchair cushion (measurements PT)    Recommendations for Other Services       Precautions / Restrictions Precautions Precautions: Fall Precaution Comments: orthostatic, check vitals (BP drops) Restrictions Weight Bearing Restrictions: Yes LLE Weight Bearing: Touchdown weight bearing    Mobility  Bed Mobility Overal bed mobility: Needs Assistance Bed Mobility: Supine to Sit     Supine to sit: Min assist     General bed mobility comments: Pt required assistance to elevate trunk and advance LLE to edge of bed.  Transfers Overall transfer level: Needs assistance Equipment used: Rolling walker (2 wheeled) Transfers: Sit to/from Stand Sit to Stand: Mod assist;+2 safety/equipment         General transfer comment: Cues for hand placement with moderate assistance to elevate from a lower seated surface.  Once in standing continues to present with dizziness,  Ambulation/Gait Ambulation/Gait assistance: Min  assist;+2 physical assistance(GT deferred due to dizziness, pt only performed 3 steps from bed to recliner.  Pt required Min +2.) Gait Distance (Feet): 3 Feet Assistive device: Rolling walker (2 wheeled) Gait Pattern/deviations: Step-to pattern;Trunk flexed         Stairs             Wheelchair Mobility    Modified Rankin (Stroke Patients Only)       Balance     Sitting balance-Leahy Scale: Poor Sitting balance - Comments: Posterior lean noted     Standing balance-Leahy Scale: Poor Standing balance comment: Reliant on UE support; unable to tolerate challenge                            Cognition Arousal/Alertness: Awake/alert Behavior During Therapy: WFL for tasks assessed/performed;Anxious Overall Cognitive Status: Within Functional Limits for tasks assessed                                        Exercises      General Comments        Pertinent Vitals/Pain Pain Assessment: 0-10 Pain Score: 5  Pain Location: LLE Pain Descriptors / Indicators: Burning;Grimacing;Guarding Pain Intervention(s): Monitored during session;Repositioned    Home Living                      Prior Function            PT Goals (current goals can now be found in the  care plan section) Acute Rehab PT Goals Patient Stated Goal: "To feel better." PT Goal Formulation: With patient Potential to Achieve Goals: Good Progress towards PT goals: Progressing toward goals    Frequency    Min 5X/week      PT Plan Current plan remains appropriate    Co-evaluation              AM-PAC PT "6 Clicks" Mobility   Outcome Measure  Help needed turning from your back to your side while in a flat bed without using bedrails?: None Help needed moving from lying on your back to sitting on the side of a flat bed without using bedrails?: None Help needed moving to and from a bed to a chair (including a wheelchair)?: A Little Help needed standing up  from a chair using your arms (e.g., wheelchair or bedside chair)?: A Little Help needed to walk in hospital room?: A Lot Help needed climbing 3-5 steps with a railing? : A Lot 6 Click Score: 18    End of Session Equipment Utilized During Treatment: Gait belt Activity Tolerance: Patient tolerated treatment well Patient left: in chair;with call bell/phone within reach Nurse Communication: Mobility status PT Visit Diagnosis: Other abnormalities of gait and mobility (R26.89);Pain Pain - Right/Left: Left Pain - part of body: Hip;Leg;Knee     Time: [id number] PT Time Calculation (min) (ACUTE ONLY): 23 min  Charges:  $Therapeutic Activity: 23-37 mins                     Joycelyn RuaAimee Pratt Bress, PTA Acute Rehabilitation Services Pager [phone] Office [phone]     Lis Savitt Artis DelayJ Cambryn Charters 03/01/2019, 4:56 PM

## 2019-03-02 ENCOUNTER — Encounter (HOSPITAL_COMMUNITY): Payer: Self-pay | Admitting: General Practice

## 2019-03-02 LAB — CBC
HCT: 30.8 % — ABNORMAL LOW (ref 39.0–52.0)
Hemoglobin: 9.9 g/dL — ABNORMAL LOW (ref 13.0–17.0)
MCH: 29.4 pg (ref 26.0–34.0)
MCHC: 32.1 g/dL (ref 30.0–36.0)
MCV: 91.4 fL (ref 80.0–100.0)
Platelets: 176 10*3/uL (ref 150–400)
RBC: 3.37 MIL/uL — ABNORMAL LOW (ref 4.22–5.81)
RDW: 15.5 % (ref 11.5–15.5)
WBC: 7.8 10*3/uL (ref 4.0–10.5)
nRBC: 0 % (ref 0.0–0.2)

## 2019-03-02 LAB — BASIC METABOLIC PANEL
Anion gap: 10 (ref 5–15)
BUN: 15 mg/dL (ref 8–23)
CO2: 20 mmol/L — ABNORMAL LOW (ref 22–32)
Calcium: 8.5 mg/dL — ABNORMAL LOW (ref 8.9–10.3)
Chloride: 104 mmol/L (ref 98–111)
Creatinine, Ser: 0.96 mg/dL (ref 0.61–1.24)
GFR calc Af Amer: >60 mL/min (ref >60)
GFR calc non Af Amer: >60 mL/min (ref >60)
Glucose, Bld: 135 mg/dL — ABNORMAL HIGH (ref 70–99)
Potassium: 3.7 mmol/L (ref 3.5–5.1)
Sodium: 134 mmol/L — ABNORMAL LOW (ref 135–145)

## 2019-03-02 MED ORDER — LOSARTAN POTASSIUM 50 MG PO TABS
25.0000 mg | ORAL_TABLET | Freq: Every day | ORAL | Status: DC
  Administered 2019-03-03 – 2019-03-04 (×2): 25 mg via ORAL
  Filled 2019-03-02 (×2): qty 1

## 2019-03-02 MED ORDER — SODIUM CHLORIDE 0.9 % IV SOLN
INTRAVENOUS | Status: DC
  Administered 2019-03-02 – 2019-03-04 (×3): via INTRAVENOUS

## 2019-03-02 MED ORDER — SENNOSIDES-DOCUSATE SODIUM 8.6-50 MG PO TABS
2.0000 | ORAL_TABLET | Freq: Two times a day (BID) | ORAL | Status: DC
  Administered 2019-03-02 – 2019-03-06 (×8): 2 via ORAL
  Filled 2019-03-02 (×8): qty 2

## 2019-03-02 MED ORDER — DILTIAZEM HCL ER COATED BEADS 180 MG PO CP24
180.0000 mg | ORAL_CAPSULE | Freq: Every day | ORAL | Status: DC
  Administered 2019-03-03 – 2019-03-04 (×2): 180 mg via ORAL
  Filled 2019-03-02 (×2): qty 1

## 2019-03-02 MED ORDER — POLYETHYLENE GLYCOL 3350 17 G PO PACK
17.0000 g | PACK | Freq: Once | ORAL | Status: AC
  Administered 2019-03-02: 17 g via ORAL
  Filled 2019-03-02: qty 1

## 2019-03-02 MED ORDER — MIDODRINE HCL 5 MG PO TABS
5.0000 mg | ORAL_TABLET | Freq: Three times a day (TID) | ORAL | Status: DC
  Administered 2019-03-02 – 2019-03-03 (×3): 5 mg via ORAL
  Filled 2019-03-02 (×4): qty 1

## 2019-03-02 MED ORDER — MORPHINE SULFATE (PF) 2 MG/ML IV SOLN: 1.0000 mg | INTRAVENOUS | Status: DC | PRN

## 2019-03-02 NOTE — Progress Notes (Signed)
Physical Therapy Treatment Patient Details Name: Mark LoanKenneth Pham MRN: [id number] DOB: [birth year] Today's Date: 03/02/2019    History of Present Illness Pt is a [age] y.o. male admitted 02/24/19 after fall sustaining L periprosthetic distal femur fx. S/p L femoral IM nail 6/27. PMH includes L TKA (2006), HTN, asthma.    PT Comments    Pt continues to be limited due to orthostatic hyptension.  He is symptomatic and reports feeling dizzy.  Pt unable to focus on weight bearing due to dizziness so opted for transfer from bed to recliner chair.  Orthostatic vitals obtained.  Pt diaphoretic post session.  BPs Lying-139/87 Sitting-126/85 Standing- 96/66   Follow Up Recommendations  Supervision for mobility/OOB;SNF     Equipment Recommendations  Wheelchair (measurements PT);Wheelchair cushion (measurements PT)    Recommendations for Other Services       Precautions / Restrictions Precautions Precautions: Fall Precaution Comments: orthostatic, check vitals (BP drops) Restrictions Weight Bearing Restrictions: Yes LLE Weight Bearing: Partial weight bearing    Mobility  Bed Mobility Overal bed mobility: Needs Assistance Bed Mobility: Supine to Sit     Supine to sit: Min assist     General bed mobility comments: Pt required assistance to elevate trunk and advance LLE to edge of bed.  Transfers Overall transfer level: Needs assistance Equipment used: Rolling walker (2 wheeled) Transfers: Sit to/from Stand Sit to Stand: Mod assist;+2 safety/equipment         General transfer comment: Cues for hand placement with moderate assistance to elevate from a lower seated surface.  Once in standing continues to present with dizziness,  Ambulation/Gait Ambulation/Gait assistance: Min assist;+2 safety/equipment Gait Distance (Feet): 3 Feet(Continue to limit gt to steps to chair due to dizziness and + orthostatics.) Assistive device: Rolling walker (2 wheeled) Gait Pattern/deviations:  Step-to pattern;Trunk flexed     General Gait Details: Hop to pattern with assistance to turn and back to seated surface.   Stairs             Wheelchair Mobility    Modified Rankin (Stroke Patients Only)       Balance Overall balance assessment: Needs assistance Sitting-balance support: Single extremity supported;No upper extremity supported Sitting balance-Leahy Scale: Poor Sitting balance - Comments: Posterior lean noted     Standing balance-Leahy Scale: Poor Standing balance comment: Reliant on UE support; unable to tolerate challenge                            Cognition Arousal/Alertness: Awake/alert Behavior During Therapy: WFL for tasks assessed/performed;Anxious Overall Cognitive Status: Within Functional Limits for tasks assessed                                        Exercises      General Comments        Pertinent Vitals/Pain Pain Assessment: 0-10 Pain Score: 5  Pain Location: LLE Pain Descriptors / Indicators: Burning;Grimacing;Guarding Pain Intervention(s): Monitored during session;Repositioned    Home Living                      Prior Function            PT Goals (current goals can now be found in the care plan section) Acute Rehab PT Goals Potential to Achieve Goals: Good Progress towards PT goals: Progressing toward goals    Frequency  Min 5X/week      PT Plan Current plan remains appropriate    Co-evaluation              AM-PAC PT "6 Clicks" Mobility   Outcome Measure  Help needed turning from your back to your side while in a flat bed without using bedrails?: None Help needed moving from lying on your back to sitting on the side of a flat bed without using bedrails?: None Help needed moving to and from a bed to a chair (including a wheelchair)?: A Little Help needed standing up from a chair using your arms (e.g., wheelchair or bedside chair)?: A Little Help needed to walk  in hospital room?: A Lot Help needed climbing 3-5 steps with a railing? : A Lot 6 Click Score: 18    End of Session Equipment Utilized During Treatment: Gait belt Activity Tolerance: Patient tolerated treatment well Patient left: in chair;with call bell/phone within reach Nurse Communication: Mobility status PT Visit Diagnosis: Other abnormalities of gait and mobility (R26.89);Pain Pain - Right/Left: Left Pain - part of body: Hip;Leg;Knee     Time: 1638-4536 PT Time Calculation (min) (ACUTE ONLY): 15 min  Charges:  $Therapeutic Activity: 8-22 mins                     Governor Rooks, PTA Acute Rehabilitation Services Pager [phone] Office [phone]     Mark Pham Mark Pham 03/02/2019, 11:52 AM

## 2019-03-02 NOTE — Progress Notes (Signed)
OT Cancellation Note  Patient Details Name: Mark Pham MRN: [id number] DOB: 09-08-1945   Cancelled Treatment:    Reason Eval/Treat Not Completed: Other (comment). Pt recently finished working with PT and reports still feeling a bit lightheaded, "I'm not up for it right now." Plan to reattempt.   Tyrone Schimke, OT Acute Rehabilitation Services Pager: [phone] Office: [phone]  03/02/2019, 11:58 AM

## 2019-03-02 NOTE — Progress Notes (Signed)
Pt's wife Yehuda Printup updated about pt's plan of care of today.

## 2019-03-02 NOTE — Progress Notes (Signed)
Patient Demographics:    Mark Pham, is a 73 y.o. male, DOB - 1946/03/25, WUJ:[id number]  Admit date - 02/24/2019   Admitting Physician Clydie Braunondell A Smith, MD  Outpatient Primary MD for the patient is Podraza, Rudy Jewole Christopher, PA-C  LOS - 6   Chief Complaint  Patient presents with  . Fall        Subjective:    Mark Pham today has no fevers, no emesis,  No chest pain, patient continues to be very orthostatic... BPs Lying-139/87 Sitting-126/85 Standing- 96/66  Had significant dizziness and diaphoresis with attempt to work with physical therapy  Assessment  & Plan :    Active Problems:   Closed fracture of left distal femur (HCC)   Hypothyroidism   Type 2 diabetes mellitus without complication (HCC)   Normocytic anemia   Paroxysmal atrial fibrillation (HCC)  Brief summary 73 y.o.malewith medical history significant ofHTN, PAFs/pcryoablation 10/2013 on aspirin, HLD, CAD (nonobstructive by cath in 2012), asthma, and hypothyroidism admitted on 02/18/2019 with left distal femur fracture after a fall in the pool at home, status post intramedullary nailing 02/25/2019, approved for SNF rehab on 03/02/2019 remains very orthostatic and symptomatic BPs Lying-139/87 Sitting-126/85 Standing- 96/66   A/p 1) orthostatic hypotension--- hemoglobin is 9.9, on admission it was 12.2, systolic BP went from 139 to 96mmhg from laying to standing position, patient was very diaphoretic dizzy--- give gentle hydration with normal saline, check serum cortisol level in a.m., start  midodrine  2) acute blood loss anemia----postop anemia on admission hemoglobin was 12.2 down to 9.9 continue to monitor anticipate some drop in H&H with hydration  3)HTN--orthostatic hypotension noted, continue atenolol 25 mg daily, decrease Cardizem 280 mg daily from 240, decrease losartan to 25 mg daily from 100 mg,   4)Hypothyroidism--- stable, continue levothyroxine TSH was 3.3 on 12/30/2018  5)PAFib--- stable, status post prior cryoablation, decrease Cardizem 280 mg daily from 240 due to orthostatic hypotension continue atenolol 25 mg daily  6)DM2-A1c was 5.8 on 12/30/2018, okay to discontinue metformin  Disposition/Need for in-Hospital Stay- patient unable to be discharged at this time due to--Had significant dizziness and diaphoresis with attempt to work with physical therapy  Lying-139/87 Sitting-126/85 Standing- 96/66  Code Status : Full  Family Communication:   NA (patient is alert, awake and coherent)   Disposition Plan  : SNF when hemodynamically more stable  Consults  :  ortho  DVT Prophylaxis  :  Lovenox -  - SCDs    Lab Results  Component Value Date   PLT 176 03/02/2019    Inpatient Medications  Scheduled Meds: . aspirin  81 mg Oral Daily  . atenolol  25 mg Oral Daily  . colesevelam  1,875 mg Oral BID WC  . [START ON 03/03/2019] diltiazem  180 mg Oral Daily  . enoxaparin (LOVENOX) injection  40 mg Subcutaneous Q24H  . febuxostat  40 mg Oral BID  . ferrous sulfate  325 mg Oral Q breakfast  . levothyroxine  75 mcg Oral QAC breakfast  . [START ON 03/03/2019] losartan  25 mg Oral Daily  . midodrine  5 mg Oral TID WC  . mometasone-formoterol  2 puff Inhalation BID  . senna-docusate  2 tablet Oral BID   Continuous Infusions: . sodium  chloride    . methocarbamol (ROBAXIN) IV     PRN Meds:.albuterol, gabapentin, hydrALAZINE, HYDROcodone-acetaminophen, methocarbamol **OR** methocarbamol (ROBAXIN) IV, metoCLOPramide **OR** metoCLOPramide (REGLAN) injection, morphine injection, ondansetron (ZOFRAN) IV    Anti-infectives (From admission, onward)   Start     Dose/Rate Route Frequency Ordered Stop   02/25/19 1700  ceFAZolin (ANCEF) IVPB 2g/100 mL premix     2 g 200 mL/hr over 30 Minutes Intravenous Every 6 hours 02/25/19 1353 02/26/19 0511   02/25/19 0600  ceFAZolin (ANCEF) IVPB  2g/100 mL premix     2 g 200 mL/hr over 30 Minutes Intravenous On call to O.R. 02/24/19 1846 02/25/19 1103        Objective:   Vitals:   03/02/19 0743 03/02/19 0831 03/02/19 1130 03/02/19 1500  BP:  (!) 179/83  116/67  Pulse: 74 76  80  Resp: 16 17  18   Temp:  99.7 F (37.6 C)  98.7 F (37.1 C)  TempSrc:  Oral  Oral  SpO2: 98% 97% 97%   Weight:      Height:        Wt Readings from Last 3 Encounters:  02/24/19 104.3 kg  08/22/16 112.5 kg  01/02/13 112 kg     Intake/Output Summary (Last 24 hours) at 03/02/2019 1655 Last data filed at 03/02/2019 1500 Gross per 24 hour  Intake 360 ml  Output 2550 ml  Net -2190 ml    Physical Exam  Gen:- Awake Alert, in no apparent distress  HEENT:- [employer].AT, No sclera icterus Neck-Supple Neck,No JVD,.  Lungs-  CTAB , fair symmetrical air movement CV- S1, S2 normal, regular  Abd-  +ve B.Sounds, Abd Soft, No tenderness,    Extremity/Skin:-Left legpostop wound clean dry and intact, pedal pulses present  Psych-affect is appropriate, oriented x3 Neuro-no new focal deficits, no tremors   Data Review:   Micro Results Recent Results (from the past 240 hour(s))  SARS Coronavirus 2 (CEPHEID - Performed in [facility] lab), Hosp Order     Status: None   Collection Time: 02/24/19  1:47 PM   Specimen: Nasopharyngeal Swab  Result Value Ref Range Status   SARS Coronavirus 2 NEGATIVE NEGATIVE Final    Comment: (NOTE) If result is NEGATIVE SARS-CoV-2 target nucleic acids are NOT DETECTED. The SARS-CoV-2 RNA is generally detectable in upper and lower  respiratory specimens during the acute phase of infection. The lowest  concentration of SARS-CoV-2 viral copies this assay can detect is 250  copies / mL. A negative result does not preclude SARS-CoV-2 infection  and should not be used as the sole basis for treatment or other  patient management decisions.  A negative result may occur with  improper specimen collection / handling,  submission of specimen other  than nasopharyngeal swab, presence of viral mutation(s) within the  areas targeted by this assay, and inadequate number of viral copies  (<250 copies / mL). A negative result must be combined with clinical  observations, patient history, and epidemiological information. If result is POSITIVE SARS-CoV-2 target nucleic acids are DETECTED. The SARS-CoV-2 RNA is generally detectable in upper and lower  respiratory specimens dur ing the acute phase of infection.  Positive  results are indicative of active infection with SARS-CoV-2.  Clinical  correlation with patient history and other diagnostic information is  necessary to determine patient infection status.  Positive results do  not rule out bacterial infection or co-infection with other viruses. If result is PRESUMPTIVE POSTIVE SARS-CoV-2 nucleic acids MAY BE  PRESENT.   A presumptive positive result was obtained on the submitted specimen  and confirmed on repeat testing.  While 2019 novel coronavirus  (SARS-CoV-2) nucleic acids may be present in the submitted sample  additional confirmatory testing may be necessary for epidemiological  and / or clinical management purposes  to differentiate between  SARS-CoV-2 and other Sarbecovirus currently known to infect humans.  If clinically indicated additional testing with an alternate test  methodology [phone]) is advised. The SARS-CoV-2 RNA is generally  detectable in upper and lower respiratory sp ecimens during the acute  phase of infection. The expected result is Negative. Fact Sheet for Patients:  BoilerBrush.com.cy Fact Sheet for Healthcare Providers: https://pope.com/ This test is not yet approved or cleared by the [place] FDA and has been authorized for detection and/or diagnosis of SARS-CoV-2 by FDA under an Emergency Use Authorization (EUA).  This EUA will remain in effect (meaning this test can be  used) for the duration of the COVID-19 declaration under Section 564(b)(1) of the Act, 21 U.S.C. section 360bbb-3(b)(1), unless the authorization is terminated or revoked sooner. Performed at [facility], [address] [place], [place] [address]   Surgical pcr screen     Status: None   Collection Time: 02/24/19  6:48 PM   Specimen: Nasal Mucosa; Nasal Swab  Result Value Ref Range Status   MRSA, PCR NEGATIVE NEGATIVE Final   Staphylococcus aureus NEGATIVE NEGATIVE Final    Comment: (NOTE) The Xpert SA Assay (FDA approved for NASAL specimens in patients 44 years of age and older), is one component of a comprehensive surveillance program. It is not intended to diagnose infection nor to guide or monitor treatment. Performed at [facility], [address], [place], [place] [address]   SARS Coronavirus 2 (CEPHEID - Performed in [facility] hospital lab), Hosp Order     Status: None   Collection Time: 03/01/19  4:59 PM   Specimen: Nasopharyngeal Swab  Result Value Ref Range Status   SARS Coronavirus 2 NEGATIVE NEGATIVE Final    Comment: (NOTE) If result is NEGATIVE SARS-CoV-2 target nucleic acids are NOT DETECTED. The SARS-CoV-2 RNA is generally detectable in upper and lower  respiratory specimens during the acute phase of infection. The lowest  concentration of SARS-CoV-2 viral copies this assay can detect is 250  copies / mL. A negative result does not preclude SARS-CoV-2 infection  and should not be used as the sole basis for treatment or other  patient management decisions.  A negative result may occur with  improper specimen collection / handling, submission of specimen other  than nasopharyngeal swab, presence of viral mutation(s) within the  areas targeted by this assay, and inadequate number of viral copies  (<250 copies / mL). A negative result must be combined with clinical  observations, patient history, and epidemiological information. If result is  POSITIVE SARS-CoV-2 target nucleic acids are DETECTED. The SARS-CoV-2 RNA is generally detectable in upper and lower  respiratory specimens dur ing the acute phase of infection.  Positive  results are indicative of active infection with SARS-CoV-2.  Clinical  correlation with patient history and other diagnostic information is  necessary to determine patient infection status.  Positive results do  not rule out bacterial infection or co-infection with other viruses. If result is PRESUMPTIVE POSTIVE SARS-CoV-2 nucleic acids MAY BE PRESENT.   A presumptive positive result was obtained on the submitted specimen  and confirmed on repeat testing.  While 2019 novel coronavirus  (  SARS-CoV-2) nucleic acids may be present in the submitted sample  additional confirmatory testing may be necessary for epidemiological  and / or clinical management purposes  to differentiate between  SARS-CoV-2 and other Sarbecovirus currently known to infect humans.  If clinically indicated additional testing with an alternate test  methodology [phone]) is advised. The SARS-CoV-2 RNA is generally  detectable in upper and lower respiratory sp ecimens during the acute  phase of infection. The expected result is Negative. Fact Sheet for Patients:  StrictlyIdeas.no Fact Sheet for Healthcare Providers: BankingDealers.co.za This test is not yet approved or cleared by the [place] FDA and has been authorized for detection and/or diagnosis of SARS-CoV-2 by FDA under an Emergency Use Authorization (EUA).  This EUA will remain in effect (meaning this test can be used) for the duration of the COVID-19 declaration under Section 564(b)(1) of the Act, 21 U.S.C. section 360bbb-3(b)(1), unless the authorization is terminated or revoked sooner. Performed at [facility], [place] [address] [place], [place] [address]     Radiology Reports Dg Chest Port 1 View  Result  Date: 02/24/2019 CLINICAL DATA:  Femur fracture EXAM: PORTABLE CHEST 1 VIEW COMPARISON:  11/11/2018, 11/09/2018 FINDINGS: Surgical hardware in the cervical spine. Cardiomegaly. No focal opacity or pleural effusion. No pneumothorax. IMPRESSION: No active disease.  Mild cardiomegaly Electronically Signed   By: Donavan Foil M.D.   On: 02/24/2019 18:51   Dg Tibia/fibula Left Port  Result Date: 02/24/2019 CLINICAL DATA:  Femur fracture EXAM: PORTABLE LEFT TIBIA AND FIBULA - 2 VIEW COMPARISON:  01/26/2005 FINDINGS: Left knee replacement. Oblique lucency in the distal shaft and metaphysis of the femur extending towards the femoral component. No fracture or malalignment of the tibia or fibula. IMPRESSION: 1. Left knee replacement. No acute osseous abnormality of left tibia or fibula. 2. Suspected linear fracture lucency within the distal shaft and metaphysis of the femur extending toward the femoral component Electronically Signed   By: Donavan Foil M.D.   On: 02/24/2019 18:51   Dg C-arm 1-60 Min  Result Date: 02/25/2019 CLINICAL DATA:  Left femoral shaft fracture. EXAM: LEFT FEMUR 2 VIEWS; DG C-ARM 61-120 MIN COMPARISON:  Radiographs dated 02/24/2019 FINDINGS: Multiple C-arm images demonstrate the patient undergoing insertion of intramedullary nail in the left femur with proximal and distal fixation screws. The hardware appears in excellent position. Marked improvement in alignment and position of the fracture fragments. Left total knee prosthesis noted. IMPRESSION: Open reduction and internal fixation of distal left femoral shaft fracture. Near anatomic alignment and position. FLUOROSCOPY TIME:  2 minutes 54 seconds C-arm fluoroscopic images were obtained intraoperatively and submitted for post operative interpretation. Electronically Signed   By: Lorriane Shire M.D.   On: 02/25/2019 14:36   Dg Femur Portable 1 View Left  Result Date: 02/24/2019 CLINICAL DATA:  [place].  Left distal leg pain and swelling. EXAM:  LEFT FEMUR PORTABLE 1 VIEW COMPARISON:  None. FINDINGS: There is a spiral type fracture of the distal femoral shaft at the middle third distal third junction region, well above the femoral prosthesis. There is approximately 1 cortex width of displacement in the single plane. IMPRESSION: Spiral type fracture of the distal femoral shaft. Electronically Signed   By: Marijo Sanes M.D.   On: 02/24/2019 15:00   Dg Femur Min 2 Views Left  Result Date: 02/25/2019 CLINICAL DATA:  Left femoral shaft fracture. EXAM: LEFT FEMUR 2 VIEWS; DG C-ARM 61-120 MIN COMPARISON:  Radiographs dated 02/24/2019 FINDINGS: Multiple C-arm images demonstrate  the patient undergoing insertion of intramedullary nail in the left femur with proximal and distal fixation screws. The hardware appears in excellent position. Marked improvement in alignment and position of the fracture fragments. Left total knee prosthesis noted. IMPRESSION: Open reduction and internal fixation of distal left femoral shaft fracture. Near anatomic alignment and position. FLUOROSCOPY TIME:  2 minutes 54 seconds C-arm fluoroscopic images were obtained intraoperatively and submitted for post operative interpretation. Electronically Signed   By: Francene BoyersJames  Maxwell M.D.   On: 02/25/2019 14:36   Dg Femur Port Min 2 Views Left  Result Date: 02/24/2019 CLINICAL DATA:  Femur fracture EXAM: LEFT FEMUR PORTABLE 2 VIEWS COMPARISON:  02/24/2019 FINDINGS: Prior left knee replacement. Acute spiral fracture distal shaft of the femur with slightly greater than 1/2 bone with of anterior and 1/4 bone with of medial displacement of distal fracture fragment. Suspected linear fracture lucency extending to the metaphyseal region of the femur towards the edge of the femoral prosthesis. IMPRESSION: Acute displaced fracture involving the distal femoral shaft with suspected linear fracture lucency extending to the metaphyseal region of the distal femur, toward the femoral component of the knee  replacement. Electronically Signed   By: Jasmine PangKim  Fujinaga M.D.   On: 02/24/2019 18:49     CBC Recent Labs  Lab 02/24/19 1336 02/25/19 0512 02/27/19 0314 03/02/19 1143  WBC 8.3 7.0 8.6 7.8  HGB 12.2* 11.0* 8.9* 9.9*  HCT 39.6 35.4* 28.3* 30.8*  PLT 165 149* 126* 176  MCV 93.6 92.9 92.5 91.4  MCH 28.8 28.9 29.1 29.4  MCHC 30.8 31.1 31.4 32.1  RDW 14.6 15.0 15.4 15.5  LYMPHSABS  --   --  1.3  --   MONOABS  --   --  0.9  --   EOSABS  --   --  0.2  --   BASOSABS  --   --  0.0  --     Chemistries  Recent Labs  Lab 02/24/19 1336 02/25/19 0512 02/27/19 0314 03/02/19 1143  NA 142 140 138 134*  K 3.6 4.0 4.0 3.7  CL 109 104 106 104  CO2 22 28 23  20*  GLUCOSE 136* 125* 101* 135*  BUN 16 14 25* 15  CREATININE 0.87 0.93 1.00 0.96  CALCIUM 8.8* 8.8* 8.3* 8.5*   ------------------------------------------------------------------------------------------------------------------ No results for input(s): CHOL, HDL, LDLCALC, TRIG, CHOLHDL, LDLDIRECT in the last 72 hours.  No results found for: HGBA1C ------------------------------------------------------------------------------------------------------------------ No results for input(s): TSH, T4TOTAL, T3FREE, THYROIDAB in the last 72 hours.  Invalid input(s): FREET3 ------------------------------------------------------------------------------------------------------------------ No results for input(s): VITAMINB12, FOLATE, FERRITIN, TIBC, IRON, RETICCTPCT in the last 72 hours.  Coagulation profile No results for input(s): INR, PROTIME in the last 168 hours.  No results for input(s): DDIMER in the last 72 hours.  Cardiac Enzymes No results for input(s): CKMB, TROPONINI, MYOGLOBIN in the last 168 hours.  Invalid input(s): CK ------------------------------------------------------------------------------------------------------------------ No results found for: BNP   Shon Haleourage Jaydalee Bardwell M.D on 03/02/2019 at 4:55 PM  Go to  [email or web address] - for contact info  [facility] - Office  [phone]

## 2019-03-03 DIAGNOSIS — I951 Orthostatic hypotension: Secondary | ICD-10-CM | POA: Diagnosis present

## 2019-03-03 LAB — CORTISOL: Cortisol, Plasma: 2.6 ug/dL

## 2019-03-03 LAB — BASIC METABOLIC PANEL
Anion gap: 9 (ref 5–15)
BUN: 18 mg/dL (ref 8–23)
CO2: 22 mmol/L (ref 22–32)
Calcium: 8.5 mg/dL — ABNORMAL LOW (ref 8.9–10.3)
Chloride: 105 mmol/L (ref 98–111)
Creatinine, Ser: 0.83 mg/dL (ref 0.61–1.24)
GFR calc Af Amer: >60 mL/min (ref >60)
GFR calc non Af Amer: >60 mL/min (ref >60)
Glucose, Bld: 95 mg/dL (ref 70–99)
Potassium: 4 mmol/L (ref 3.5–5.1)
Sodium: 136 mmol/L (ref 135–145)

## 2019-03-03 LAB — CBC
HCT: 28.1 % — ABNORMAL LOW (ref 39.0–52.0)
Hemoglobin: 8.9 g/dL — ABNORMAL LOW (ref 13.0–17.0)
MCH: 29.5 pg (ref 26.0–34.0)
MCHC: 31.7 g/dL (ref 30.0–36.0)
MCV: 93 fL (ref 80.0–100.0)
Platelets: 145 10*3/uL — ABNORMAL LOW (ref 150–400)
RBC: 3.02 MIL/uL — ABNORMAL LOW (ref 4.22–5.81)
RDW: 15.9 % — ABNORMAL HIGH (ref 11.5–15.5)
WBC: 6.8 10*3/uL (ref 4.0–10.5)
nRBC: 0 % (ref 0.0–0.2)

## 2019-03-03 MED ORDER — LACTULOSE 10 GM/15ML PO SOLN
30.0000 g | Freq: Once | ORAL | Status: AC
  Administered 2019-03-03: 30 g via ORAL
  Filled 2019-03-03: qty 45

## 2019-03-03 MED ORDER — COSYNTROPIN 0.25 MG IJ SOLR
0.2500 mg | Freq: Once | INTRAMUSCULAR | Status: AC
  Administered 2019-03-04: 0.25 mg via INTRAVENOUS
  Filled 2019-03-03 (×2): qty 0.25

## 2019-03-03 NOTE — Progress Notes (Signed)
Pt had very large, loose green bm this evening. Was able to void a large amount of urine on the floor as well. States he is feeling less discomfort in his lower abdomen.

## 2019-03-03 NOTE — Progress Notes (Signed)
Patient Demographics:    Mark Pham Gholson, is a 73 y.o. male, DOB - 04-May-1946, ZOX:[id number]  Admit date - 02/24/2019   Admitting Physician Clydie Braunondell A Smith, MD  Outpatient Primary MD for the patient is Bailey Mechodraza, Cole Christopher, PA-C  LOS - 7   Chief Complaint  Patient presents with   Fall        Subjective:    Mark Pham Needs today has no fevers, no emesis,  No chest pain, patient continues to be very orthostatic...  BPs Lying-158/90 Sitting- 141/93 Standing- 111/80  Patient continues to have significant dizziness  with attempt to work with physical therapy  Assessment  & Plan :    Active Problems:   Orthostatic hypotension   Closed fracture of left distal femur (HCC)   Hypothyroidism   Type 2 diabetes mellitus without complication (HCC)   Normocytic anemia   Paroxysmal atrial fibrillation (HCC)  Brief summary 73 y.o.malewith medical history significant ofHTN, PAFs/pcryoablation 10/2013 on aspirin, HLD, CAD (nonobstructive by cath in 2012), asthma, and hypothyroidism admitted on 02/18/2019 with left distal femur fracture after a fall in the pool at home, status post intramedullary nailing 02/25/2019, approved for SNF rehab on 03/02/2019--- unable to discharge to SNF at this time due to persistent and very symptomatic orthostatic hypotension making it difficult for him to adequately participate in rehab --AM cortisol level 2.6, ACTH stimulation test to be done on 03/04/2019 in a.m.  A/p 1)Orthostatic Hypotension--- --Am serum cortisol is only 2.6 today---which is strongly suggestive of adrenal insufficiency---we will proceed with ACTH stimulation test (--Standard high-dose test (250 mcg))  -- Discussed with Pharmacist and RN to coordinate with lab/phlebotomy in am   Please see BP recordings in subjective section above, continue gentle hydration with normal saline, stop midodrine,   --Stop  Midodrine  -- if ACTH is +ve then consider further imaging studies  2) acute blood loss anemia----postop anemia on admission hemoglobin was 12.2 , hemoglobin is 8.9 (dropped from 9.9 may be hemodilution), monitor and transfuse as indicated  3)HTN--orthostatic hypotension noted, continue atenolol 25 mg daily, decreased Cardizem 180 mg daily from 240, decreased losartan to 25 mg daily from 100 mg,  4)Hypothyroidism--- stable, continue levothyroxine TSH was 3.3 on 12/30/2018  5)PAFib--- stable, status post prior cryoablation, decrease Cardizem CD 180 mg daily from 240 due to orthostatic hypotension continue atenolol 25 mg daily  6)DM2-A1c was 5.8 on 12/30/2018,  discontinued metformin  Disposition/Need for in-Hospital Stay- patient unable to be discharged at this time due to--Had significant dizziness and diaphoresis with attempt to work with physical therapy -unable to discharge to SNF at this time due to persistent and very symptomatic orthostatic hypotension making it difficult for him to adequately participate in rehab --AM cortisol level 2.6, ACTH stimulation test to be done on 03/04/2019 in a.m.  Code Status : Full  Family Communication:  (patient is alert, awake and coherent) Discussed with Wife Serena CroissantSusan Lampert -[phone]  Disposition Plan  : SNF when hemodynamically more stable  Consults  :  ortho  DVT Prophylaxis  :  Lovenox -  - SCDs    Lab Results  Component Value Date   PLT 145 (L) 03/03/2019    Inpatient Medications  Scheduled Meds:  aspirin  81 mg Oral Daily  atenolol  25 mg Oral Daily   colesevelam  1,875 mg Oral BID WC   [START ON 03/04/2019] cosyntropin  0.25 mg Intravenous Once   diltiazem  180 mg Oral Daily   enoxaparin (LOVENOX) injection  40 mg Subcutaneous Q24H   febuxostat  40 mg Oral BID   ferrous sulfate  325 mg Oral Q breakfast   levothyroxine  75 mcg Oral QAC breakfast   losartan  25 mg Oral Daily   mometasone-formoterol  2 puff Inhalation BID    senna-docusate  2 tablet Oral BID   Continuous Infusions:  sodium chloride 50 mL/hr at 03/03/19 1456   methocarbamol (ROBAXIN) IV     PRN Meds:.albuterol, gabapentin, hydrALAZINE, HYDROcodone-acetaminophen, methocarbamol **OR** methocarbamol (ROBAXIN) IV, metoCLOPramide **OR** metoCLOPramide (REGLAN) injection, morphine injection, ondansetron (ZOFRAN) IV    Anti-infectives (From admission, onward)   Start     Dose/Rate Route Frequency Ordered Stop   02/25/19 1700  ceFAZolin (ANCEF) IVPB 2g/100 mL premix     2 g 200 mL/hr over 30 Minutes Intravenous Every 6 hours 02/25/19 1353 02/26/19 0511   02/25/19 0600  ceFAZolin (ANCEF) IVPB 2g/100 mL premix     2 g 200 mL/hr over 30 Minutes Intravenous On call to O.R. 02/24/19 1846 02/25/19 1103        Objective:   Vitals:   03/02/19 2032 03/03/19 0722 03/03/19 0923 03/03/19 1543  BP: (!) 143/87 (!) 154/86  (!) 143/77  Pulse: 65 61 72 (!) 58  Resp: 16 18 17 16   Temp: 99.2 F (37.3 C) 98 F (36.7 C)  98.4 F (36.9 C)  TempSrc: Oral Oral  Oral  SpO2: 97% 100% 98% 97%  Weight:      Height:        Wt Readings from Last 3 Encounters:  02/24/19 104.3 kg  08/22/16 112.5 kg  01/02/13 112 kg     Intake/Output Summary (Last 24 hours) at 03/03/2019 1548 Last data filed at 03/03/2019 1445 Gross per 24 hour  Intake 480 ml  Output 1450 ml  Net -970 ml    Physical Exam  Gen:- Awake Alert, in no apparent distress  HEENT:- [place].AT, No sclera icterus Neck-Supple Neck,No JVD,.  Lungs-  CTAB , fair symmetrical air movement CV- S1, S2 normal, regular  Abd-  +ve B.Sounds, Abd Soft, No tenderness,    Extremity/Skin:-Left leg postop wound clean dry and intact, pedal pulses present  Psych-affect is appropriate, oriented x3 Neuro-no new focal deficits, no tremors   Data Review:   Micro Results Recent Results (from the past 240 hour(s))  SARS Coronavirus 2 (CEPHEID - Performed in [facility] hospital lab), Hosp Order     Status: None    Collection Time: 02/24/19  1:47 PM   Specimen: Nasopharyngeal Swab  Result Value Ref Range Status   SARS Coronavirus 2 NEGATIVE NEGATIVE Final    Comment: (NOTE) If result is NEGATIVE SARS-CoV-2 target nucleic acids are NOT DETECTED. The SARS-CoV-2 RNA is generally detectable in upper and lower  respiratory specimens during the acute phase of infection. The lowest  concentration of SARS-CoV-2 viral copies this assay can detect is 250  copies / mL. A negative result does not preclude SARS-CoV-2 infection  and should not be used as the sole basis for treatment or other  patient management decisions.  A negative result may occur with  improper specimen collection / handling, submission of specimen other  than nasopharyngeal swab, presence of viral mutation(s) within the  areas targeted by this assay,  and inadequate number of viral copies  (<250 copies / mL). A negative result must be combined with clinical  observations, patient history, and epidemiological information. If result is POSITIVE SARS-CoV-2 target nucleic acids are DETECTED. The SARS-CoV-2 RNA is generally detectable in upper and lower  respiratory specimens dur ing the acute phase of infection.  Positive  results are indicative of active infection with SARS-CoV-2.  Clinical  correlation with patient history and other diagnostic information is  necessary to determine patient infection status.  Positive results do  not rule out bacterial infection or co-infection with other viruses. If result is PRESUMPTIVE POSTIVE SARS-CoV-2 nucleic acids MAY BE PRESENT.   A presumptive positive result was obtained on the submitted specimen  and confirmed on repeat testing.  While 2019 novel coronavirus  (SARS-CoV-2) nucleic acids may be present in the submitted sample  additional confirmatory testing may be necessary for epidemiological  and / or clinical management purposes  to differentiate between  SARS-CoV-2 and other Sarbecovirus  currently known to infect humans.  If clinically indicated additional testing with an alternate test  methodology [phone]([id number]) is advised. The SARS-CoV-2 RNA is generally  detectable in upper and lower respiratory sp ecimens during the acute  phase of infection. The expected result is Negative. Fact Sheet for Patients:  BoilerBrush.com.cyhttps://www.fda.gov/media/136312/download Fact Sheet for Healthcare Providers: https://pope.com/https://www.fda.gov/media/136313/download This test is not yet approved or cleared by the [place] FDA and has been authorized for detection and/or diagnosis of SARS-CoV-2 by FDA under an Emergency Use Authorization (EUA).  This EUA will remain in effect (meaning this test can be used) for the duration of the COVID-19 declaration under Section 564(b)(1) of the Act, 21 U.S.C. section 360bbb-3(b)(1), unless the authorization is terminated or revoked sooner. Performed at [facility], [address] [place], [place] [address]   Surgical pcr screen     Status: None   Collection Time: 02/24/19  6:48 PM   Specimen: Nasal Mucosa; Nasal Swab  Result Value Ref Range Status   MRSA, PCR NEGATIVE NEGATIVE Final   Staphylococcus aureus NEGATIVE NEGATIVE Final    Comment: (NOTE) The Xpert SA Assay (FDA approved for NASAL specimens in patients 73 years of age and older), is one component of a comprehensive surveillance program. It is not intended to diagnose infection nor to guide or monitor treatment. Performed at [facility], [address] [place], [place] [address]   SARS Coronavirus 2 (CEPHEID - Performed in [facility] hospital lab), Hosp Order     Status: None   Collection Time: 03/01/19  4:59 PM   Specimen: Nasopharyngeal Swab  Result Value Ref Range Status   SARS Coronavirus 2 NEGATIVE NEGATIVE Final    Comment: (NOTE) If result is NEGATIVE SARS-CoV-2 target nucleic acids are NOT DETECTED. The SARS-CoV-2 RNA is generally detectable in upper and lower  respiratory  specimens during the acute phase of infection. The lowest  concentration of SARS-CoV-2 viral copies this assay can detect is 250  copies / mL. A negative result does not preclude SARS-CoV-2 infection  and should not be used as the sole basis for treatment or other  patient management decisions.  A negative result may occur with  improper specimen collection / handling, submission of specimen other  than nasopharyngeal swab, presence of viral mutation(s) within the  areas targeted by this assay, and inadequate number of viral copies  (<250 copies / mL). A negative result must be combined with clinical  observations, patient history, and epidemiological  information. If result is POSITIVE SARS-CoV-2 target nucleic acids are DETECTED. The SARS-CoV-2 RNA is generally detectable in upper and lower  respiratory specimens dur ing the acute phase of infection.  Positive  results are indicative of active infection with SARS-CoV-2.  Clinical  correlation with patient history and other diagnostic information is  necessary to determine patient infection status.  Positive results do  not rule out bacterial infection or co-infection with other viruses. If result is PRESUMPTIVE POSTIVE SARS-CoV-2 nucleic acids MAY BE PRESENT.   A presumptive positive result was obtained on the submitted specimen  and confirmed on repeat testing.  While 2019 novel coronavirus  (SARS-CoV-2) nucleic acids may be present in the submitted sample  additional confirmatory testing may be necessary for epidemiological  and / or clinical management purposes  to differentiate between  SARS-CoV-2 and other Sarbecovirus currently known to infect humans.  If clinically indicated additional testing with an alternate test  methodology [phone]) is advised. The SARS-CoV-2 RNA is generally  detectable in upper and lower respiratory sp ecimens during the acute  phase of infection. The expected result is Negative. Fact Sheet for  Patients:  BoilerBrush.com.cy Fact Sheet for Healthcare Providers: https://pope.com/ This test is not yet approved or cleared by the [place] FDA and has been authorized for detection and/or diagnosis of SARS-CoV-2 by FDA under an Emergency Use Authorization (EUA).  This EUA will remain in effect (meaning this test can be used) for the duration of the COVID-19 declaration under Section 564(b)(1) of the Act, 21 U.S.C. section 360bbb-3(b)(1), unless the authorization is terminated or revoked sooner. Performed at [facility], [address], [place], [place] [address]   Culture, Urine     Status: Abnormal (Preliminary result)   Collection Time: 03/01/19  7:30 PM   Specimen: Urine, Clean Catch  Result Value Ref Range Status   Specimen Description URINE, CLEAN CATCH  Final   Special Requests NONE  Final   Culture (A)  Final    >=100,000 COLONIES/mL PROTEUS MIRABILIS SUSCEPTIBILITIES TO FOLLOW Performed at [facility], [address] [place], [place] [address]    Report Status PENDING  Incomplete    Radiology Reports Dg Chest Port 1 View  Result Date: 02/24/2019 CLINICAL DATA:  Femur fracture EXAM: PORTABLE CHEST 1 VIEW COMPARISON:  11/11/2018, 11/09/2018 FINDINGS: Surgical hardware in the cervical spine. Cardiomegaly. No focal opacity or pleural effusion. No pneumothorax. IMPRESSION: No active disease.  Mild cardiomegaly Electronically Signed   By: Jasmine Pang M.D.   On: 02/24/2019 18:51   Dg Tibia/fibula Left Port  Result Date: 02/24/2019 CLINICAL DATA:  Femur fracture EXAM: PORTABLE LEFT TIBIA AND FIBULA - 2 VIEW COMPARISON:  01/26/2005 FINDINGS: Left knee replacement. Oblique lucency in the distal shaft and metaphysis of the femur extending towards the femoral component. No fracture or malalignment of the tibia or fibula. IMPRESSION: 1. Left knee replacement. No acute osseous abnormality of left tibia or fibula. 2.  Suspected linear fracture lucency within the distal shaft and metaphysis of the femur extending toward the femoral component Electronically Signed   By: Jasmine Pang M.D.   On: 02/24/2019 18:51   Dg C-arm 1-60 Min  Result Date: 02/25/2019 CLINICAL DATA:  Left femoral shaft fracture. EXAM: LEFT FEMUR 2 VIEWS; DG C-ARM 61-120 MIN COMPARISON:  Radiographs dated 02/24/2019 FINDINGS: Multiple C-arm images demonstrate the patient undergoing insertion of intramedullary nail in the left femur with proximal and distal fixation screws. The hardware appears in excellent position.  Marked improvement in alignment and position of the fracture fragments. Left total knee prosthesis noted. IMPRESSION: Open reduction and internal fixation of distal left femoral shaft fracture. Near anatomic alignment and position. FLUOROSCOPY TIME:  2 minutes 54 seconds C-arm fluoroscopic images were obtained intraoperatively and submitted for post operative interpretation. Electronically Signed   By: Lorriane Shire M.D.   On: 02/25/2019 14:36   Dg Femur Portable 1 View Left  Result Date: 02/24/2019 CLINICAL DATA:  [place].  Left distal leg pain and swelling. EXAM: LEFT FEMUR PORTABLE 1 VIEW COMPARISON:  None. FINDINGS: There is a spiral type fracture of the distal femoral shaft at the middle third distal third junction region, well above the femoral prosthesis. There is approximately 1 cortex width of displacement in the single plane. IMPRESSION: Spiral type fracture of the distal femoral shaft. Electronically Signed   By: Marijo Sanes M.D.   On: 02/24/2019 15:00   Dg Femur Min 2 Views Left  Result Date: 02/25/2019 CLINICAL DATA:  Left femoral shaft fracture. EXAM: LEFT FEMUR 2 VIEWS; DG C-ARM 61-120 MIN COMPARISON:  Radiographs dated 02/24/2019 FINDINGS: Multiple C-arm images demonstrate the patient undergoing insertion of intramedullary nail in the left femur with proximal and distal fixation screws. The hardware appears in excellent  position. Marked improvement in alignment and position of the fracture fragments. Left total knee prosthesis noted. IMPRESSION: Open reduction and internal fixation of distal left femoral shaft fracture. Near anatomic alignment and position. FLUOROSCOPY TIME:  2 minutes 54 seconds C-arm fluoroscopic images were obtained intraoperatively and submitted for post operative interpretation. Electronically Signed   By: Lorriane Shire M.D.   On: 02/25/2019 14:36   Dg Femur Port Min 2 Views Left  Result Date: 02/24/2019 CLINICAL DATA:  Femur fracture EXAM: LEFT FEMUR PORTABLE 2 VIEWS COMPARISON:  02/24/2019 FINDINGS: Prior left knee replacement. Acute spiral fracture distal shaft of the femur with slightly greater than 1/2 bone with of anterior and 1/4 bone with of medial displacement of distal fracture fragment. Suspected linear fracture lucency extending to the metaphyseal region of the femur towards the edge of the femoral prosthesis. IMPRESSION: Acute displaced fracture involving the distal femoral shaft with suspected linear fracture lucency extending to the metaphyseal region of the distal femur, toward the femoral component of the knee replacement. Electronically Signed   By: Donavan Foil M.D.   On: 02/24/2019 18:49     CBC Recent Labs  Lab 02/25/19 0512 02/27/19 0314 03/02/19 1143 03/03/19 0216  WBC 7.0 8.6 7.8 6.8  HGB 11.0* 8.9* 9.9* 8.9*  HCT 35.4* 28.3* 30.8* 28.1*  PLT 149* 126* 176 145*  MCV 92.9 92.5 91.4 93.0  MCH 28.9 29.1 29.4 29.5  MCHC 31.1 31.4 32.1 31.7  RDW 15.0 15.4 15.5 15.9*  LYMPHSABS  --  1.3  --   --   MONOABS  --  0.9  --   --   EOSABS  --  0.2  --   --   BASOSABS  --  0.0  --   --     Chemistries  Recent Labs  Lab 02/25/19 0512 02/27/19 0314 03/02/19 1143 03/03/19 0216  NA 140 138 134* 136  K 4.0 4.0 3.7 4.0  CL 104 106 104 105  CO2 28 23 20* 22  GLUCOSE 125* 101* 135* 95  BUN 14 25* 15 18  CREATININE 0.93 1.00 0.96 0.83  CALCIUM 8.8* 8.3* 8.5* 8.5*    ------------------------------------------------------------------------------------------------------------------ No results for input(s): CHOL, HDL, LDLCALC, TRIG, CHOLHDL, LDLDIRECT  in the last 72 hours.  No results found for: HGBA1C ------------------------------------------------------------------------------------------------------------------ No results for input(s): TSH, T4TOTAL, T3FREE, THYROIDAB in the last 72 hours.  Invalid input(s): FREET3 ------------------------------------------------------------------------------------------------------------------ No results for input(s): VITAMINB12, FOLATE, FERRITIN, TIBC, IRON, RETICCTPCT in the last 72 hours.  Coagulation profile No results for input(s): INR, PROTIME in the last 168 hours.  No results for input(s): DDIMER in the last 72 hours.  Cardiac Enzymes No results for input(s): CKMB, TROPONINI, MYOGLOBIN in the last 168 hours.  Invalid input(s): CK ------------------------------------------------------------------------------------------------------------------ No results found for: BNP   Shon Hale M.D on 03/03/2019 at 3:48 PM  Go to [email or web address] - for contact info  [facility] - Office  [phone]

## 2019-03-03 NOTE — Progress Notes (Addendum)
Contacted phlebotomy lab. New tech starts shift at 0500 and will be told to come to 5N to draw blood so  cortrosyn can be given. Lab to draw blood before and 30 min after medication given.

## 2019-03-03 NOTE — Progress Notes (Signed)
Physical Therapy Treatment Patient Details Name: Mark Pham MRN: [id number] DOB: [birth year] Today's Date: 03/03/2019    History of Present Illness Pt is a 73 y.o. male admitted 02/24/19 after fall sustaining L periprosthetic distal femur fx. S/p L femoral IM nail 6/27. PMH includes L TKA (2006), HTN, asthma.    PT Comments    Pt performed gait training and functional mobility with decreased assistance.  He remains symptomatic to positional changes but able to tolerate increased activity before symptoms arise.  He continues to lack full understanding of weight bearing status and becomes defensive when given corrections for safety.  Pt required lots of encouragement.  Plan for SNF placement remains appropriate to improve strength and function before returning home.    BPs Lying-158/90 Sitting- 141/93 Standing- 111/80 Post 1st gt trial- 136/81   Follow Up Recommendations  Supervision for mobility/OOB;SNF     Equipment Recommendations  Wheelchair (measurements PT);Wheelchair cushion (measurements PT)    Recommendations for Other Services       Precautions / Restrictions Precautions Precautions: Fall Precaution Comments: orthostatic, check vitals (BP drops) Restrictions Weight Bearing Restrictions: Yes LLE Weight Bearing: Touchdown weight bearing    Mobility  Bed Mobility Overal bed mobility: Needs Assistance Bed Mobility: Sit to Supine       Sit to supine: Min assist   General bed mobility comments: Min assistance for LLE once in bed.  Pt utilized hookying method to return back to bed.  Transfers Overall transfer level: Needs assistance Equipment used: Rolling walker (2 wheeled) Transfers: Sit to/from Stand Sit to Stand: +2 safety/equipment;Min assist         General transfer comment: Pt with good carryover with hand placement continues to require min assistance to boost into standing.  Ambulation/Gait Ambulation/Gait assistance: Min assist;+2  safety/equipment Gait Distance (Feet): 20 Feet(2nd trial of 25 ft.) Assistive device: Rolling walker (2 wheeled) Gait Pattern/deviations: Step-to pattern;Trunk flexed     General Gait Details: Pt intiially maintain weight bearing but gets out of sequencing and starts to put more weight on his LLE.  After 1st trial while patient was seated PTA demonstrated correct technique.  Pt had better carryover on 2nd trial but as he fatigues he starts to put more weight on his surgical limb despite cues.   Stairs             Wheelchair Mobility    Modified Rankin (Stroke Patients Only)       Balance Overall balance assessment: Needs assistance Sitting-balance support: Single extremity supported;No upper extremity supported Sitting balance-Leahy Scale: Poor Sitting balance - Comments: Posterior lean noted     Standing balance-Leahy Scale: Poor Standing balance comment: Reliant on UE support; unable to tolerate challenge                            Cognition Arousal/Alertness: Awake/alert Behavior During Therapy: WFL for tasks assessed/performed;Anxious Overall Cognitive Status: Within Functional Limits for tasks assessed                                        Exercises      General Comments        Pertinent Vitals/Pain Pain Assessment: 0-10 Pain Score: 0-No pain    Home Living                      Prior  Function            PT Goals (current goals can now be found in the care plan section) Acute Rehab PT Goals Patient Stated Goal: "To feel better." Potential to Achieve Goals: Good Progress towards PT goals: Progressing toward goals    Frequency    Min 5X/week      PT Plan Current plan remains appropriate    Co-evaluation              AM-PAC PT "6 Clicks" Mobility   Outcome Measure  Help needed turning from your back to your side while in a flat bed without using bedrails?: None Help needed moving from lying on  your back to sitting on the side of a flat bed without using bedrails?: None Help needed moving to and from a bed to a chair (including a wheelchair)?: A Little Help needed standing up from a chair using your arms (e.g., wheelchair or bedside chair)?: A Little Help needed to walk in hospital room?: A Little Help needed climbing 3-5 steps with a railing? : A Lot 6 Click Score: 19    End of Session Equipment Utilized During Treatment: Gait belt Activity Tolerance: Patient tolerated treatment well Patient left: in bed;with bed alarm set;with call bell/phone within reach Nurse Communication: Mobility status PT Visit Diagnosis: Other abnormalities of gait and mobility (R26.89);Pain Pain - Right/Left: Left Pain - part of body: Hip;Leg;Knee     Time: 1740-8144 PT Time Calculation (min) (ACUTE ONLY): 26 min  Charges:  $Gait Training: 8-22 mins $Therapeutic Activity: 8-22 mins                     Governor Rooks, PTA Acute Rehabilitation Services Pager [phone] Office [phone]     Mark Pham 03/03/2019, 3:07 PM

## 2019-03-03 NOTE — Plan of Care (Signed)

## 2019-03-04 ENCOUNTER — Inpatient Hospital Stay (HOSPITAL_COMMUNITY): Payer: Medicare Other

## 2019-03-04 DIAGNOSIS — E2740 Unspecified adrenocortical insufficiency: Secondary | ICD-10-CM

## 2019-03-04 LAB — ACTH STIMULATION, 3 TIME POINTS
Cortisol, 30 Min: 20.4 ug/dL
Cortisol, 60 Min: 24.1 ug/dL
Cortisol, Base: 9.1 ug/dL

## 2019-03-04 LAB — URINE CULTURE: Culture: >=100000 — AB

## 2019-03-04 LAB — CBC
HCT: 29.3 % — ABNORMAL LOW (ref 39.0–52.0)
Hemoglobin: 9.2 g/dL — ABNORMAL LOW (ref 13.0–17.0)
MCH: 29.1 pg (ref 26.0–34.0)
MCHC: 31.4 g/dL (ref 30.0–36.0)
MCV: 92.7 fL (ref 80.0–100.0)
Platelets: 175 10*3/uL (ref 150–400)
RBC: 3.16 MIL/uL — ABNORMAL LOW (ref 4.22–5.81)
RDW: 15.7 % — ABNORMAL HIGH (ref 11.5–15.5)
WBC: 6.3 10*3/uL (ref 4.0–10.5)
nRBC: 0 % (ref 0.0–0.2)

## 2019-03-04 LAB — BASIC METABOLIC PANEL
Anion gap: 9 (ref 5–15)
BUN: 12 mg/dL (ref 8–23)
CO2: 21 mmol/L — ABNORMAL LOW (ref 22–32)
Calcium: 8.5 mg/dL — ABNORMAL LOW (ref 8.9–10.3)
Chloride: 108 mmol/L (ref 98–111)
Creatinine, Ser: 0.73 mg/dL (ref 0.61–1.24)
GFR calc Af Amer: >60 mL/min (ref >60)
GFR calc non Af Amer: >60 mL/min (ref >60)
Glucose, Bld: 94 mg/dL (ref 70–99)
Potassium: 3.9 mmol/L (ref 3.5–5.1)
Sodium: 138 mmol/L (ref 135–145)

## 2019-03-04 LAB — TSH: TSH: 1.541 u[IU]/mL (ref 0.350–4.500)

## 2019-03-04 LAB — T4, FREE: Free T4: 1.44 ng/dL — ABNORMAL HIGH (ref 0.61–1.12)

## 2019-03-04 MED ORDER — HYDROCORTISONE 20 MG PO TABS: 20.0000 mg | ORAL_TABLET | Freq: Two times a day (BID) | ORAL | Status: DC

## 2019-03-04 MED ORDER — AMOXICILLIN-POT CLAVULANATE 875-125 MG PO TABS: 1.0000 | ORAL_TABLET | Freq: Two times a day (BID) | ORAL | Status: DC

## 2019-03-04 MED ORDER — METOPROLOL TARTRATE 50 MG PO TABS
50.0000 mg | ORAL_TABLET | Freq: Two times a day (BID) | ORAL | Status: DC
  Administered 2019-03-05 – 2019-03-06 (×3): 50 mg via ORAL
  Filled 2019-03-04 (×3): qty 1

## 2019-03-04 MED ORDER — HYDROCORTISONE 5 MG PO TABS
15.0000 mg | ORAL_TABLET | Freq: Two times a day (BID) | ORAL | Status: AC
  Administered 2019-03-04 – 2019-03-05 (×4): 15 mg via ORAL
  Filled 2019-03-04 (×4): qty 1

## 2019-03-04 MED ORDER — METHOCARBAMOL 500 MG PO TABS: 500.0000 mg | ORAL_TABLET | Freq: Three times a day (TID) | ORAL | Status: DC | PRN

## 2019-03-04 MED ORDER — METHOCARBAMOL 1000 MG/10ML IJ SOLN
500.0000 mg | Freq: Four times a day (QID) | INTRAVENOUS | Status: DC | PRN
  Filled 2019-03-04: qty 5

## 2019-03-04 MED ORDER — SODIUM CHLORIDE 0.9 % IV SOLN
2.0000 g | INTRAVENOUS | Status: DC
  Administered 2019-03-04 – 2019-03-05 (×2): 2 g via INTRAVENOUS
  Filled 2019-03-04 (×2): qty 20

## 2019-03-04 MED ORDER — DILTIAZEM HCL 30 MG PO TABS: 30.0000 mg | ORAL_TABLET | Freq: Four times a day (QID) | ORAL | Status: DC | PRN

## 2019-03-04 NOTE — Progress Notes (Addendum)
0530 Lab here. Cortrosyn given at 0535 after initial blood draw.

## 2019-03-04 NOTE — Progress Notes (Signed)
PROGRESS NOTE  Mark LoanKenneth Cremer JWJ:[id number] DOB: 10-13-1945 DOA: 02/24/2019 PCP: Bailey MechPodraza, Cole Christopher, PA-C   LOS: 8 days   Patient is from: Home  Brief Narrative / Interim history: 73 year old male with history of PAF s/p cryoablation in 10/2013 now on aspirin, nonobstructive CAD based on cath from 2012, HTN, asthma, HLD and hypothyroidism admitted with left distal femoral fracture after fall in the pool at home.  Underwent intramedullary nailing on 02/25/2019, approved for SNF rehab point 03/02/2019 but could not be discharged due to persistent and very symptomatic orthostatic hypotension.  A.m. cortisol level 2.6 on 7/3.  ACTH stimulation test done on 7/4 with base cortisol of 9.1 and improvement to 20 and 24 at 30 and 60 minutes respectively.   Subjective: Patient reports feeling great.  He has not walked this morning but denies dizziness, palpitation, chest pain, dyspnea or generalized weakness.  SBP in the range of 140s to 160s.  Orthostatic vitals remains positive with SBP dropping from 138-112.  BMP not impressive.  Objective: Vitals:   03/04/19 1056 03/04/19 1058 03/04/19 1100 03/04/19 1106  BP: 138/84 137/77 106/74 112/79  Pulse: (!) 57 (!) 57 67 64  Resp:      Temp:      TempSrc:      SpO2:    100%  Weight:      Height:        Intake/Output Summary (Last 24 hours) at 03/04/2019 1236 Last data filed at 03/04/2019 1156 Gross per 24 hour  Intake 2375.5 ml  Output 2740 ml  Net -364.5 ml   Filed Weights   02/24/19 1335  Weight: 104.3 kg    Examination:  GENERAL: No acute distress.  Appears well.  HEENT: MMM.  Vision and hearing grossly intact.  NECK: Supple.  No JVD.  LUNGS:  No IWOB. Good air movement bilaterally. HEART:  RRR. Heart sounds normal.  ABD: Bowel sounds present. Soft. Non tender.  MSK/EXT:  Moves extremities.  Dressing over LLE appears clean and dry.  Brisk cap refills distally. SKIN: Dressing over LLE dry and clean. NEURO: Awake, alert and oriented  appropriately.  No gross deficit.  PSYCH: Calm. Normal affect.   I have personally reviewed the following labs and images:  Radiology Studies: No results found.  Microbiology: Recent Results (from the past 240 hour(s))  SARS Coronavirus 2 (CEPHEID - Performed in [facility] lab), Hosp Order     Status: None   Collection Time: 02/24/19  1:47 PM   Specimen: Nasopharyngeal Swab  Result Value Ref Range Status   SARS Coronavirus 2 NEGATIVE NEGATIVE Final    Comment: (NOTE) If result is NEGATIVE SARS-CoV-2 target nucleic acids are NOT DETECTED. The SARS-CoV-2 RNA is generally detectable in upper and lower  respiratory specimens during the acute phase of infection. The lowest  concentration of SARS-CoV-2 viral copies this assay can detect is 250  copies / mL. A negative result does not preclude SARS-CoV-2 infection  and should not be used as the sole basis for treatment or other  patient management decisions.  A negative result may occur with  improper specimen collection / handling, submission of specimen other  than nasopharyngeal swab, presence of viral mutation(s) within the  areas targeted by this assay, and inadequate number of viral copies  (<250 copies / mL). A negative result must be combined with clinical  observations, patient history, and epidemiological information. If result is POSITIVE SARS-CoV-2 target nucleic acids are DETECTED. The SARS-CoV-2 RNA is generally detectable in  upper and lower  respiratory specimens dur ing the acute phase of infection.  Positive  results are indicative of active infection with SARS-CoV-2.  Clinical  correlation with patient history and other diagnostic information is  necessary to determine patient infection status.  Positive results do  not rule out bacterial infection or co-infection with other viruses. If result is PRESUMPTIVE POSTIVE SARS-CoV-2 nucleic acids MAY BE PRESENT.   A presumptive positive result was obtained on  the submitted specimen  and confirmed on repeat testing.  While 2019 novel coronavirus  (SARS-CoV-2) nucleic acids may be present in the submitted sample  additional confirmatory testing may be necessary for epidemiological  and / or clinical management purposes  to differentiate between  SARS-CoV-2 and other Sarbecovirus currently known to infect humans.  If clinically indicated additional testing with an alternate test  methodology [phone]([id number]) is advised. The SARS-CoV-2 RNA is generally  detectable in upper and lower respiratory sp ecimens during the acute  phase of infection. The expected result is Negative. Fact Sheet for Patients:  BoilerBrush.com.cyhttps://www.fda.gov/media/136312/download Fact Sheet for Healthcare Providers: https://pope.com/https://www.fda.gov/media/136313/download This test is not yet approved or cleared by the [place] FDA and has been authorized for detection and/or diagnosis of SARS-CoV-2 by FDA under an Emergency Use Authorization (EUA).  This EUA will remain in effect (meaning this test can be used) for the duration of the COVID-19 declaration under Section 564(b)(1) of the Act, 21 U.S.C. section 360bbb-3(b)(1), unless the authorization is terminated or revoked sooner. Performed at [facility], [address] [place], [place] [address]   Surgical pcr screen     Status: None   Collection Time: 02/24/19  6:48 PM   Specimen: Nasal Mucosa; Nasal Swab  Result Value Ref Range Status   MRSA, PCR NEGATIVE NEGATIVE Final   Staphylococcus aureus NEGATIVE NEGATIVE Final    Comment: (NOTE) The Xpert SA Assay (FDA approved for NASAL specimens in patients 73 years of age and older), is one component of a comprehensive surveillance program. It is not intended to diagnose infection nor to guide or monitor treatment. Performed at [facility], [address] [place], [place] [address]   SARS Coronavirus 2 (CEPHEID - Performed in [facility] hospital lab), Hosp Order     Status:  None   Collection Time: 03/01/19  4:59 PM   Specimen: Nasopharyngeal Swab  Result Value Ref Range Status   SARS Coronavirus 2 NEGATIVE NEGATIVE Final    Comment: (NOTE) If result is NEGATIVE SARS-CoV-2 target nucleic acids are NOT DETECTED. The SARS-CoV-2 RNA is generally detectable in upper and lower  respiratory specimens during the acute phase of infection. The lowest  concentration of SARS-CoV-2 viral copies this assay can detect is 250  copies / mL. A negative result does not preclude SARS-CoV-2 infection  and should not be used as the sole basis for treatment or other  patient management decisions.  A negative result may occur with  improper specimen collection / handling, submission of specimen other  than nasopharyngeal swab, presence of viral mutation(s) within the  areas targeted by this assay, and inadequate number of viral copies  (<250 copies / mL). A negative result must be combined with clinical  observations, patient history, and epidemiological information. If result is POSITIVE SARS-CoV-2 target nucleic acids are DETECTED. The SARS-CoV-2 RNA is generally detectable in upper and lower  respiratory specimens dur ing the acute phase of infection.  Positive  results are indicative of active infection with SARS-CoV-2.  Clinical  correlation with patient history and other diagnostic information is  necessary to determine patient infection status.  Positive results do  not rule out bacterial infection or co-infection with other viruses. If result is PRESUMPTIVE POSTIVE SARS-CoV-2 nucleic acids MAY BE PRESENT.   A presumptive positive result was obtained on the submitted specimen  and confirmed on repeat testing.  While 2019 novel coronavirus  (SARS-CoV-2) nucleic acids may be present in the submitted sample  additional confirmatory testing may be necessary for epidemiological  and / or clinical management purposes  to differentiate between  SARS-CoV-2 and other  Sarbecovirus currently known to infect humans.  If clinically indicated additional testing with an alternate test  methodology [phone]([id number]) is advised. The SARS-CoV-2 RNA is generally  detectable in upper and lower respiratory sp ecimens during the acute  phase of infection. The expected result is Negative. Fact Sheet for Patients:  BoilerBrush.com.cyhttps://www.fda.gov/media/136312/download Fact Sheet for Healthcare Providers: https://pope.com/https://www.fda.gov/media/136313/download This test is not yet approved or cleared by the [place] FDA and has been authorized for detection and/or diagnosis of SARS-CoV-2 by FDA under an Emergency Use Authorization (EUA).  This EUA will remain in effect (meaning this test can be used) for the duration of the COVID-19 declaration under Section 564(b)(1) of the Act, 21 U.S.C. section 360bbb-3(b)(1), unless the authorization is terminated or revoked sooner. Performed at [facility], [address] [place], [place] [address]   Culture, Urine     Status: Abnormal   Collection Time: 03/01/19  7:30 PM   Specimen: Urine, Clean Catch  Result Value Ref Range Status   Specimen Description URINE, CLEAN CATCH  Final   Special Requests   Final    NONE Performed at [facility], [address] [place], [place] [address]    Culture >=100,000 COLONIES/mL PROTEUS MIRABILIS (A)  Final   Report Status 03/04/2019 FINAL  Final   Organism ID, Bacteria PROTEUS MIRABILIS (A)  Final      Susceptibility   Proteus mirabilis - MIC*    AMPICILLIN <=2 SENSITIVE Sensitive     CEFAZOLIN 8 SENSITIVE Sensitive     CEFTRIAXONE <=1 SENSITIVE Sensitive     CIPROFLOXACIN <=0.25 SENSITIVE Sensitive     GENTAMICIN <=1 SENSITIVE Sensitive     IMIPENEM 4 SENSITIVE Sensitive     NITROFURANTOIN 128 RESISTANT Resistant     TRIMETH/SULFA <=20 SENSITIVE Sensitive     AMPICILLIN/SULBACTAM <=2 SENSITIVE Sensitive     PIP/TAZO <=4 SENSITIVE Sensitive     * >=100,000 COLONIES/mL PROTEUS MIRABILIS     Sepsis Labs: Invalid input(s): PROCALCITONIN, LACTICIDVEN  Urine analysis:    Component Value Date/Time   COLORURINE YELLOW 03/01/2019 1930   APPEARANCEUR HAZY (A) 03/01/2019 1930   LABSPEC 1.014 03/01/2019 1930   PHURINE 7.0 03/01/2019 1930   GLUCOSEU NEGATIVE 03/01/2019 1930   HGBUR LARGE (A) 03/01/2019 1930   BILIRUBINUR NEGATIVE 03/01/2019 1930   KETONESUR 20 (A) 03/01/2019 1930   PROTEINUR 30 (A) 03/01/2019 1930   UROBILINOGEN 1.0 12/20/2008 2012   NITRITE NEGATIVE 03/01/2019 1930   LEUKOCYTESUR SMALL (A) 03/01/2019 1930    Anemia Panel: No results for input(s): VITAMINB12, FOLATE, FERRITIN, TIBC, IRON, RETICCTPCT in the last 72 hours.  Thyroid Function Tests: No results for input(s): TSH, T4TOTAL, FREET4, T3FREE, THYROIDAB in the last 72 hours.  Lipid Profile: No results for input(s): CHOL, HDL, LDLCALC, TRIG, CHOLHDL, LDLDIRECT in the last 72 hours.  CBG: Recent Labs  Lab 02/25/19 1304  GLUCAP 109*  HbA1C: No results for input(s): HGBA1C in the last 72 hours.  BNP (last 3 results): No results for input(s): PROBNP in the last 8760 hours.  Cardiac Enzymes: No results for input(s): CKTOTAL, CKMB, CKMBINDEX, TROPONINI in the last 168 hours.  Coagulation Profile: No results for input(s): INR, PROTIME in the last 168 hours.  Liver Function Tests: No results for input(s): AST, ALT, ALKPHOS, BILITOT, PROT, ALBUMIN in the last 168 hours. No results for input(s): LIPASE, AMYLASE in the last 168 hours. No results for input(s): AMMONIA in the last 168 hours.  Basic Metabolic Panel: Recent Labs  Lab 02/27/19 0314 03/02/19 1143 03/03/19 0216 03/04/19 0829  NA 138 134* 136 138  K 4.0 3.7 4.0 3.9  CL 106 104 105 108  CO2 23 20* 22 21*  GLUCOSE 101* 135* 95 94  BUN 25* CREATININE 1.00 0.96 0.83 0.73  CALCIUM 8.3* 8.5* 8.5* 8.5*   GFR: Estimated Creatinine Clearance: 101.9 mL/min (by C-G formula based on SCr of 0.73 mg/dL).  CBC: Recent  Labs  Lab 02/27/19 0314 03/02/19 1143 03/03/19 0216 03/04/19 0829  WBC 8.6 7.8 6.8 6.3  NEUTROABS 6.2  --   --   --   HGB 8.9* 9.9* 8.9* 9.2*  HCT 28.3* 30.8* 28.1* 29.3*  MCV 92.5 91.4 93.0 92.7  PLT 126* 176 145* 175    Procedures:  6/27: Intramedullary nailing of left femoral fracture  Microbiology: COVID-19 negative  Assessment & Plan: Orthostatic hypotension: Orthostatic vitals remains positive.  A.m. cortisol on 7/3 was 2.6.  ACTH stimulation test with base cortisol level of 9.1 that has increased to 20 and 24 at 30 and 60 minutes respectively suggesting intact adrenal glands but questionable secondary adrenal insufficiency.  We do not have baseline ACTH level.  There is alos discrepancy between his cortisol level on 7/3 and 7/4.  He is also on 2 nodal blocking agents, atenolol and Cardizem,  which can contribute.  Denies recent steroid use. -Treat standing blood pressure -Discontinue atenolol, losartan and Cardizem -We will start metoprolol at 50 mg twice daily -Cardizem 30 mg every 6 hours as needed tachycardia -Check ACTH, TSH, free T4, renin and aldosterone levels -TED hose -Start p.o. Solu-Cortef 15 mg twice daily -MRI brain  Hypertension: Lying blood pressure 147/81.  Standing BP after 3 minutes 112/79 -Plan as above  Mechanical fall at home Left distal femoral fracture -Intramedullary nailing on 02/25/2019 -Approved for SNF  Anemia of chronic disease: Hgb 12.2 on admission.  Dropped to 8.9 and up to 9.2 this morning.  There could be some elements of blood loss from blood draws, surgery and also hemodilution. -Check anemia panel in the morning -Continue monitoring  Hypothyroidism: -Check TSH -Continue home Synthroid  Permanent A. Fib: Rate controlled with Cardizem and atenolol -Cardiac med adjustment as above.  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  On metformin at home. -Intermittently check CBG  History of COPD?:  Stable. -Continue home med  DVT prophylaxis: Subcu  Lovenox Code Status: Full code Family Communication: Attempted to call patient's wife but no answer. Disposition Plan: Remains inpatient due to significant orthostasis and possible adrenal insufficiency Consultants: Orthopedic surgery  Antimicrobials: Anti-infectives (From admission, onward)   Start     Dose/Rate Route Frequency Ordered Stop   02/25/19 1700  ceFAZolin (ANCEF) IVPB 2g/100 mL premix     2 g 200 mL/hr over 30 Minutes Intravenous Every 6 hours 02/25/19 1353 02/26/19 0511   02/25/19 0600  ceFAZolin (ANCEF) IVPB 2g/100 mL premix  2 g 200 mL/hr over 30 Minutes Intravenous On call to O.R. 02/24/19 1846 02/25/19 1103      Sch Meds:  Scheduled Meds: . aspirin  81 mg Oral Daily  . colesevelam  1,875 mg Oral BID WC  . enoxaparin (LOVENOX) injection  40 mg Subcutaneous Q24H  . febuxostat  40 mg Oral BID  . ferrous sulfate  325 mg Oral Q breakfast  . hydrocortisone  15 mg Oral BID  . levothyroxine  75 mcg Oral QAC breakfast  . [START ON 03/05/2019] metoprolol tartrate  50 mg Oral BID  . mometasone-formoterol  2 puff Inhalation BID  . senna-docusate  2 tablet Oral BID   Continuous Infusions: . sodium chloride 40 mL/hr at 03/04/19 0926  . methocarbamol (ROBAXIN) IV     PRN Meds:.albuterol, diltiazem, gabapentin, HYDROcodone-acetaminophen, methocarbamol **OR** methocarbamol (ROBAXIN) IV, metoCLOPramide **OR** metoCLOPramide (REGLAN) injection, ondansetron (ZOFRAN) IV  35 minutes with more than 50% spent in reviewing records, counseling patient and coordinating care.  Jordon Kristiansen T. [place]  If 7PM-7AM, please contact night-coverage [email or web address] Password [facility] 03/04/2019, 12:36 PM

## 2019-03-04 NOTE — Plan of Care (Signed)

## 2019-03-04 NOTE — Plan of Care (Signed)
  Problem: Pain Managment: Goal: General experience of comfort will improve Outcome: Progressing   

## 2019-03-05 ENCOUNTER — Inpatient Hospital Stay (HOSPITAL_COMMUNITY): Payer: Medicare Other

## 2019-03-05 DIAGNOSIS — N390 Urinary tract infection, site not specified: Secondary | ICD-10-CM

## 2019-03-05 LAB — BASIC METABOLIC PANEL
Anion gap: 6 (ref 5–15)
BUN: 13 mg/dL (ref 8–23)
CO2: 22 mmol/L (ref 22–32)
Calcium: 8.5 mg/dL — ABNORMAL LOW (ref 8.9–10.3)
Chloride: 112 mmol/L — ABNORMAL HIGH (ref 98–111)
Creatinine, Ser: 0.73 mg/dL (ref 0.61–1.24)
GFR calc Af Amer: >60 mL/min (ref >60)
GFR calc non Af Amer: >60 mL/min (ref >60)
Glucose, Bld: 102 mg/dL — ABNORMAL HIGH (ref 70–99)
Potassium: 3.6 mmol/L (ref 3.5–5.1)
Sodium: 140 mmol/L (ref 135–145)

## 2019-03-05 MED ORDER — GADOBUTROL 1 MMOL/ML IV SOLN
10.4000 mL | Freq: Once | INTRAVENOUS | Status: AC | PRN
  Administered 2019-03-05: 10.4 mL via INTRAVENOUS

## 2019-03-05 MED ORDER — LOSARTAN POTASSIUM 50 MG PO TABS
50.0000 mg | ORAL_TABLET | Freq: Every day | ORAL | Status: DC
  Administered 2019-03-05 – 2019-03-06 (×2): 50 mg via ORAL
  Filled 2019-03-05 (×2): qty 1

## 2019-03-05 NOTE — Progress Notes (Addendum)
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  Underwent intramedullary nailing on 02/25/2019, approved for SNF rehab point 03/02/2019 but could not be discharged due to persistent and very symptomatic orthostatic hypotension.  A.m. cortisol level 2.6 on 03/03/19.  ACTH stimulation test done on 7/4 with base cortisol of 9.1 and improvement to 20 and 24 at 30 and 60 minutes respectively.  Patient was started on oral Solu-Cortef 50 mg twice daily.  Also continued his atenolol and Cardizem and started on metoprolol for A. Fib.  Also noted that patient had a urinalysis and urine culture which was positive for pansensitive Proteus mirabilis.  Started on ceftriaxone. MRI brain obtained on 03/05/2019 without acute finding.  Personally reviewed the image with Dr. Maree Erie who confirmed that there is no pathologic finding of pituitary gland.  Subjective: Patient reports feeling well.  Was not symptomatic when they did orthostatic vitals this morning although the second 1 was positive.  Denies chest pain, dyspnea, palpitation or lightheadedness.  Denies GI symptoms.  Has had some dysuria after Foley catheter that has improved.  Denies fever.  Patient was somewhat frustrated and confused about his long stay in the hospital and some communication breakdown.  I had a lengthy discussion explaining about his medical condition and treatment plan.  At the end, patient is satisfied and very appreciative.  Objective: Vitals:   03/05/19 0950 03/05/19 1027 03/05/19 1029 03/05/19 1033  BP: (!) 156/94 (!) 150/84 134/82 121/88  Pulse: 70 71 88 92  Resp: 16      Temp:  98 F (36.7 C)    TempSrc:  Oral    SpO2: 96% 100% 100% 100%  Weight:      Height:        Intake/Output Summary (Last 24 hours) at 03/05/2019 1322 Last data filed at 03/05/2019 0944 Gross per 24 hour  Intake 1512.39 ml  Output 1800 ml  Net -287.61 ml   Filed Weights   02/24/19 1335  Weight: 104.3 kg    Examination:  GENERAL: No acute distress.  Appears well.  HEENT: MMM.  Vision and hearing grossly intact.  NECK: Supple.  No apparent JVD LUNGS:  No IWOB. Good air movement bilaterally. HEART:  RRR. Heart sounds normal.  ABD: Bowel sounds present. Soft. Non tender.  MSK/EXT:  Moves all extremities.  Clean and dry Ace wrap over LLE.  Cap refills brisk distally. SKIN: Clean and dry Ace wrap over LLE. NEURO: Awake, alert and oriented appropriately.  No gross deficit.  PSYCH: Calm. Normal affect.  I have personally reviewed the following labs and images:  Radiology Studies: Mr Jeri Cos Wo Contrast  Result Date: 03/05/2019 CLINICAL DATA:  Postoperative hypotension and dizziness. EXAM: MRI HEAD WITHOUT AND WITH CONTRAST TECHNIQUE: Multiplanar, multiecho pulse sequences of the brain and surrounding structures were obtained without and with intravenous contrast. CONTRAST:  10 cc Gadavist COMPARISON:  None. FINDINGS: Brain: Diffusion imaging does not show any acute or subacute infarction. There is mild generalized age related atrophy. No focal abnormality is seen affecting the brainstem or cerebellum. Cerebral hemispheres show moderate chronic small-vessel ischemic changes of the white matter. No cortical  or large vessel territory infarction. No mass lesion, hemorrhage, hydrocephalus or extra-axial collection. Vascular: Major vessels at the base of the brain show flow. Skull and upper cervical spine: Negative Sinuses/Orbits: Clear/normal Other: None IMPRESSION: No acute or specific finding by MRI. Age related atrophy. Moderate chronic small-vessel ischemic changes of the cerebral  hemispheric white matter. Electronically Signed   By: Paulina FusiMark  Shogry M.D.   On: 03/05/2019 12:25    Microbiology: Recent Results (from the past 240 hour(s))  SARS Coronavirus 2 (CEPHEID - Performed in [facility] hospital lab), Hosp Order     Status: None   Collection Time: 02/24/19  1:47 PM   Specimen: Nasopharyngeal Swab  Result Value Ref Range Status   SARS Coronavirus 2 NEGATIVE NEGATIVE Final    Comment: (NOTE) If result is NEGATIVE SARS-CoV-2 target nucleic acids are NOT DETECTED. The SARS-CoV-2 RNA is generally detectable in upper and lower  respiratory specimens during the acute phase of infection. The lowest  concentration of SARS-CoV-2 viral copies this assay can detect is 250  copies / mL. A negative result does not preclude SARS-CoV-2 infection  and should not be used as the sole basis for treatment or other  patient management decisions.  A negative result may occur with  improper specimen collection / handling, submission of specimen other  than nasopharyngeal swab, presence of viral mutation(s) within the  areas targeted by this assay, and inadequate number of viral copies  (<250 copies / mL). A negative result must be combined with clinical  observations, patient history, and epidemiological information. If result is POSITIVE SARS-CoV-2 target nucleic acids are DETECTED. The SARS-CoV-2 RNA is generally detectable in upper and lower  respiratory specimens dur ing the acute phase of infection.  Positive  results are indicative of active infection with SARS-CoV-2.  Clinical  correlation with patient history and other diagnostic information is  necessary to determine patient infection status.  Positive results do  not rule out bacterial infection or co-infection with other viruses. If result is PRESUMPTIVE POSTIVE SARS-CoV-2 nucleic acids MAY BE PRESENT.   A presumptive positive result was obtained on the submitted specimen  and confirmed on repeat testing.  While 2019  novel coronavirus  (SARS-CoV-2) nucleic acids may be present in the submitted sample  additional confirmatory testing may be necessary for epidemiological  and / or clinical management purposes  to differentiate between  SARS-CoV-2 and other Sarbecovirus currently known to infect humans.  If clinically indicated additional testing with an alternate test  methodology [phone]([id number]) is advised. The SARS-CoV-2 RNA is generally  detectable in upper and lower respiratory sp ecimens during the acute  phase of infection. The expected result is Negative. Fact Sheet for Patients:  BoilerBrush.com.cyhttps://www.fda.gov/media/136312/download Fact Sheet for Healthcare Providers: https://pope.com/https://www.fda.gov/media/136313/download This test is not yet approved or cleared by the [place] FDA and has been authorized for detection and/or diagnosis of SARS-CoV-2 by FDA under an Emergency Use Authorization (EUA).  This EUA will remain in effect (meaning this test can be used) for the duration of the COVID-19 declaration under Section 564(b)(1) of the Act, 21 U.S.C. section 360bbb-3(b)(1), unless the authorization is terminated or revoked sooner. Performed at [facility], [address] [place], [place] [address]   Surgical pcr screen     Status: None   Collection Time: 02/24/19  6:48 PM   Specimen: Nasal Mucosa; Nasal Swab  Result Value Ref Range Status   MRSA, PCR NEGATIVE NEGATIVE Final   Staphylococcus aureus NEGATIVE NEGATIVE Final  Comment: (NOTE) The Xpert SA Assay (FDA approved for NASAL specimens in patients 73 years of age and older), is one component of a comprehensive surveillance program. It is not intended to diagnose infection nor to guide or monitor treatment. Performed at [facility], [address] [place], [place] [address]   SARS Coronavirus 2 (CEPHEID - Performed in [facility] hospital lab), Hosp Order     Status: None   Collection Time: 03/01/19  4:59 PM   Specimen: Nasopharyngeal  Swab  Result Value Ref Range Status   SARS Coronavirus 2 NEGATIVE NEGATIVE Final    Comment: (NOTE) If result is NEGATIVE SARS-CoV-2 target nucleic acids are NOT DETECTED. The SARS-CoV-2 RNA is generally detectable in upper and lower  respiratory specimens during the acute phase of infection. The lowest  concentration of SARS-CoV-2 viral copies this assay can detect is 250  copies / mL. A negative result does not preclude SARS-CoV-2 infection  and should not be used as the sole basis for treatment or other  patient management decisions.  A negative result may occur with  improper specimen collection / handling, submission of specimen other  than nasopharyngeal swab, presence of viral mutation(s) within the  areas targeted by this assay, and inadequate number of viral copies  (<250 copies / mL). A negative result must be combined with clinical  observations, patient history, and epidemiological information. If result is POSITIVE SARS-CoV-2 target nucleic acids are DETECTED. The SARS-CoV-2 RNA is generally detectable in upper and lower  respiratory specimens dur ing the acute phase of infection.  Positive  results are indicative of active infection with SARS-CoV-2.  Clinical  correlation with patient history and other diagnostic information is  necessary to determine patient infection status.  Positive results do  not rule out bacterial infection or co-infection with other viruses. If result is PRESUMPTIVE POSTIVE SARS-CoV-2 nucleic acids MAY BE PRESENT.   A presumptive positive result was obtained on the submitted specimen  and confirmed on repeat testing.  While 2019 novel coronavirus  (SARS-CoV-2) nucleic acids may be present in the submitted sample  additional confirmatory testing may be necessary for epidemiological  and / or clinical management purposes  to differentiate between  SARS-CoV-2 and other Sarbecovirus currently known to infect humans.  If clinically indicated  additional testing with an alternate test  methodology [phone]([id number]) is advised. The SARS-CoV-2 RNA is generally  detectable in upper and lower respiratory sp ecimens during the acute  phase of infection. The expected result is Negative. Fact Sheet for Patients:  BoilerBrush.com.cyhttps://www.fda.gov/media/136312/download Fact Sheet for Healthcare Providers: https://pope.com/https://www.fda.gov/media/136313/download This test is not yet approved or cleared by the [place] FDA and has been authorized for detection and/or diagnosis of SARS-CoV-2 by FDA under an Emergency Use Authorization (EUA).  This EUA will remain in effect (meaning this test can be used) for the duration of the COVID-19 declaration under Section 564(b)(1) of the Act, 21 U.S.C. section 360bbb-3(b)(1), unless the authorization is terminated or revoked sooner. Performed at [facility], [address] [place], [place] [address]   Culture, Urine     Status: Abnormal   Collection Time: 03/01/19  7:30 PM   Specimen: Urine, Clean Catch  Result Value Ref Range Status   Specimen Description URINE, CLEAN CATCH  Final   Special Requests   Final    NONE Performed at [facility], [address] [place], [place] [address]    Culture >=100,000 COLONIES/mL PROTEUS MIRABILIS (A)  Final  Report Status 03/04/2019 FINAL  Final   Organism ID, Bacteria PROTEUS MIRABILIS (A)  Final      Susceptibility   Proteus mirabilis - MIC*    AMPICILLIN <=2 SENSITIVE Sensitive     CEFAZOLIN 8 SENSITIVE Sensitive     CEFTRIAXONE <=1 SENSITIVE Sensitive     CIPROFLOXACIN <=0.25 SENSITIVE Sensitive     GENTAMICIN <=1 SENSITIVE Sensitive     IMIPENEM 4 SENSITIVE Sensitive     NITROFURANTOIN 128 RESISTANT Resistant     TRIMETH/SULFA <=20 SENSITIVE Sensitive     AMPICILLIN/SULBACTAM <=2 SENSITIVE Sensitive     PIP/TAZO <=4 SENSITIVE Sensitive     * >=100,000 COLONIES/mL PROTEUS MIRABILIS    Sepsis Labs: Invalid input(s): PROCALCITONIN, LACTICIDVEN  Urine  analysis:    Component Value Date/Time   COLORURINE YELLOW 03/01/2019 1930   APPEARANCEUR HAZY (A) 03/01/2019 1930   LABSPEC 1.014 03/01/2019 1930   PHURINE 7.0 03/01/2019 1930   GLUCOSEU NEGATIVE 03/01/2019 1930   HGBUR LARGE (A) 03/01/2019 1930   BILIRUBINUR NEGATIVE 03/01/2019 1930   KETONESUR 20 (A) 03/01/2019 1930   PROTEINUR 30 (A) 03/01/2019 1930   UROBILINOGEN 1.0 12/20/2008 2012   NITRITE NEGATIVE 03/01/2019 1930   LEUKOCYTESUR SMALL (A) 03/01/2019 1930    Anemia Panel: No results for input(s): VITAMINB12, FOLATE, FERRITIN, TIBC, IRON, RETICCTPCT in the last 72 hours.  Thyroid Function Tests: Recent Labs    03/04/19 1049  TSH 1.541  FREET4 1.44*    Lipid Profile: No results for input(s): CHOL, HDL, LDLCALC, TRIG, CHOLHDL, LDLDIRECT in the last 72 hours.  CBG: No results for input(s): GLUCAP in the last 168 hours.  HbA1C: No results for input(s): HGBA1C in the last 72 hours.  BNP (last 3 results): No results for input(s): PROBNP in the last 8760 hours.  Cardiac Enzymes: No results for input(s): CKTOTAL, CKMB, CKMBINDEX, TROPONINI in the last 168 hours.  Coagulation Profile: No results for input(s): INR, PROTIME in the last 168 hours.  Liver Function Tests: No results for input(s): AST, ALT, ALKPHOS, BILITOT, PROT, ALBUMIN in the last 168 hours. No results for input(s): LIPASE, AMYLASE in the last 168 hours. No results for input(s): AMMONIA in the last 168 hours.  Basic Metabolic Panel: Recent Labs  Lab 02/27/19 0314 03/02/19 1143 03/03/19 0216 03/04/19 0829 03/05/19 0819  NA 138 134* 136 138 140  K 4.0 3.7 4.0 3.9 3.6  CL 106 104 105 108 112*  CO2 23 20* 22 21* 22  GLUCOSE 101* 135* 95 94 102*  BUN 25* CREATININE 1.00 0.96 0.83 0.73 0.73  CALCIUM 8.3* 8.5* 8.5* 8.5* 8.5*   GFR: Estimated Creatinine Clearance: 101.9 mL/min (by C-G formula based on SCr of 0.73 mg/dL).  CBC: Recent Labs  Lab 02/27/19 0314 03/02/19 1143  03/03/19 0216 03/04/19 0829  WBC 8.6 7.8 6.8 6.3  NEUTROABS 6.2  --   --   --   HGB 8.9* 9.9* 8.9* 9.2*  HCT 28.3* 30.8* 28.1* 29.3*  MCV 92.5 91.4 93.0 92.7  PLT 126* 176 145* 175    Procedures:  6/27: Intramedullary nailing of left femoral fracture  Microbiology: COVID-19 negative  Assessment & Plan: Orthostatic hypotension: Orthostatic vitals was negative earlier this morning but positive on repeat later in the morning.  He was not symptomatic.  A.m. cortisol on 7/3 was 2.6.  ACTH stimulation test with base cortisol level of 9.1 that has increased to 20 and 24 at 30 and 60 minutes respectively suggesting intact  adrenal glands but questionable secondary adrenal insufficiency.  We do not have baseline ACTH level.  There is also discrepancy between his cortisol level on 7/3 and 7/4.  He is also on 2 nodal blocking agents, atenolol and Cardizem.  Denies recent steroid use.  TSH within normal range.  MRI brain on 7/5 without significant finding. -Treat standing blood pressure -Discontinued atenolol and Cardizem -Started metoprolol at 50 mg twice daily -Resume home losartan at lower dose-can reduce further if needed -Cardizem 30 mg every 6 hours as needed tachycardia -TED hose -Started p.o. Solu-Cortef 15 mg twice daily on 7/4  Hypertension: Standing blood pressure in normal range -Plan as above  Mechanical fall at home Left distal femoral fracture -Intramedullary nailing on 02/25/2019 -Approved for SNF  UTI: Patient had some dysuria, and positive UA and urine culture with pansensitive Proteus mirabilis -Ceftriaxone 7/4-- -can be switched to amoxicillin or Keflex on discharge.  Anemia of chronic disease: Hgb 12.2 on admission.  Stable at 9 after initial drop.  -Continue monitoring  Hypothyroidism: TSH within normal range -Continue home Synthroid  Permanent A. Fib: Rate controlled with Cardizem and atenolol -Cardiac med adjustment as above.  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  On metformin  at home. -Intermittently check CBG  History of COPD?:  Stable. -Continue home med  DVT prophylaxis: Subcu Lovenox Code Status: Full code Family Communication: Updated patient's wife over the phone (this is the addendum) Disposition Plan: Remains inpatient due to orthostasis and UTI.  Likely discharge to SNF on 7/6.  Consultants: Orthopedic surgery  Antimicrobials: Anti-infectives (From admission, onward)   Start     Dose/Rate Route Frequency Ordered Stop   03/04/19 2200  amoxicillin-clavulanate (AUGMENTIN) 875-125 MG per tablet 1 tablet  Status:  Discontinued     1 tablet Oral Every 12 hours 03/04/19 1742 03/04/19 1857   03/04/19 2000  cefTRIAXone (ROCEPHIN) 2 g in sodium chloride 0.9 % 100 mL IVPB     2 g 200 mL/hr over 30 Minutes Intravenous Every 24 hours 03/04/19 1857     02/25/19 1700  ceFAZolin (ANCEF) IVPB 2g/100 mL premix     2 g 200 mL/hr over 30 Minutes Intravenous Every 6 hours 02/25/19 1353 02/26/19 0511   02/25/19 0600  ceFAZolin (ANCEF) IVPB 2g/100 mL premix     2 g 200 mL/hr over 30 Minutes Intravenous On call to O.R. 02/24/19 1846 02/25/19 1103      Sch Meds:  Scheduled Meds:  aspirin  81 mg Oral Daily   colesevelam  1,875 mg Oral BID WC   enoxaparin (LOVENOX) injection  40 mg Subcutaneous Q24H   febuxostat  40 mg Oral BID   ferrous sulfate  325 mg Oral Q breakfast   hydrocortisone  15 mg Oral BID   levothyroxine  75 mcg Oral QAC breakfast   losartan  50 mg Oral Daily   metoprolol tartrate  50 mg Oral BID   mometasone-formoterol  2 puff Inhalation BID   senna-docusate  2 tablet Oral BID   Continuous Infusions:  sodium chloride 40 mL/hr at 03/04/19 1350   cefTRIAXone (ROCEPHIN)  IV 2 g (03/04/19 2041)   methocarbamol (ROBAXIN) IV     PRN Meds:.albuterol, diltiazem, gabapentin, HYDROcodone-acetaminophen, methocarbamol **OR** methocarbamol (ROBAXIN) IV, metoCLOPramide **OR** metoCLOPramide (REGLAN) injection, ondansetron (ZOFRAN) IV  35  minutes with more than 50% spent in reviewing records, counseling patient and family, and coordinating care.  Lucelia Lacey T. Liborio [facility]  If 7PM-7AM, please contact night-coverage [email or web address] Password [email or web address] 03/05/2019, 1:22 PM

## 2019-03-05 NOTE — Plan of Care (Signed)
  Problem: Pain Managment: Goal: General experience of comfort will improve Outcome: Progressing   

## 2019-03-05 NOTE — Progress Notes (Signed)
RN spoke with patient's wife Mark Pham on this morning related to concerns of patient's pending MRI of the brain and consultation with the patient's primary care doctor and cardiologist.   Patient informed RN that he was told by the doctor ([facility]) that he was scheduled for an MRI of the brain for Sunday July 5th, 2020 @ 0500. However, there were no active orders for an MRI of the brain scheduled for this morning. RN informed patient of this information.   Patient expressed that he was concerned as the MRI was previously scheduled but was rescheduled related to the patient having a hypotensive episode. RN also updated patient's wife Mark Pham that there are no active orders for an MRI of the brain scheduled today. However, RN reassured the patient and his wife that she would contact the doctor ([facility]) via text requesting an update of the pending MRI of the brain. RN paged [facility] prior to the end of the 0700 shift requesting [facility] to update the patient and his wife of the medical plan.  Patient's wife also expressed concern that the patient's primary care doctor has not been notified that the patient is currently hospitalized. Patient's wife requesting [facility] to notify the patient's primary care doctor of the patient's currents status.   Patient's wife Mark Pham also expresses concern that the patient's cardiologist has not been notified that the patient is currently hospitalized. Patient states that he is concerned because his heart medications of which he takes at home have been discontinued and he would like to resume his home medications today.   Nursing will continue to monitor.

## 2019-03-06 LAB — SARS CORONAVIRUS 2 BY RT PCR (HOSPITAL ORDER, PERFORMED IN ~~LOC~~ HOSPITAL LAB): SARS Coronavirus 2: NEGATIVE

## 2019-03-06 MED ORDER — CEPHALEXIN 500 MG PO CAPS: 500.0000 mg | ORAL_CAPSULE | Freq: Four times a day (QID) | ORAL | 0 refills | Status: AC

## 2019-03-06 MED ORDER — METOPROLOL TARTRATE 50 MG PO TABS: 50.0000 mg | ORAL_TABLET | Freq: Two times a day (BID) | ORAL | 0 refills | Status: AC

## 2019-03-06 MED ORDER — HYDROCORTISONE 5 MG PO TABS: 5.0000 mg | ORAL_TABLET | Freq: Every day | ORAL | 0 refills | Status: AC

## 2019-03-06 MED ORDER — SENNOSIDES-DOCUSATE SODIUM 8.6-50 MG PO TABS: 2.0000 | ORAL_TABLET | Freq: Two times a day (BID) | ORAL | 0 refills | Status: AC

## 2019-03-06 NOTE — Progress Notes (Signed)
Report given to [facility] (nurse) at [place].  A discharge packet will be sent via PTAR with discharge instructions.

## 2019-03-06 NOTE — TOC Transition Note (Signed)
Transition of Care [facility]) - CM/SW Discharge Note   Patient Details  Name: Mark Pham MRN: [id number] Date of Birth: [birth year]  [facility] CM/SW Contact:  Alberteen Sam, LCSW Phone Number: 03/06/2019, 12:01 PM   Clinical Narrative:     Patient will DC [id number] Anticipated DC date: 03/06/2019 Family notified: wife will be notified by patient Transport [id number]  Per MD patient ready for DC to [facility]. RN, patient, patient's family, and facility notified of DC. Discharge Summary sent to facility. RN given number for report [phone]  . DC packet on chart. Ambulance transport requested for patient.  CSW signing off.  [place], [place]   Final next level of care: Skilled Nursing Facility Barriers to Discharge: No Barriers Identified   Patient Goals and CMS Choice Patient states their goals for this hospitalization and ongoing recovery are:: to go to rehab then home CMS Medicare.gov Compare Post Acute Care list provided to:: Patient Choice offered to / list presented to : Patient  Discharge Placement PASRR number recieved: 03/01/19            Patient chooses bed at: [facility] Patient to be transferred to facility by: [place] Name of family member notified: spouse notified by patient Patient and family notified of of transfer: 03/06/19  Discharge Plan and Services     Post Acute Care Choice: [place]                               Social Determinants of Health (SDOH) Interventions     Readmission Risk Interventions No flowsheet data found.

## 2019-03-06 NOTE — Care Management Important Message (Signed)
Important Message  Patient Details  Name: Mark Pham MRN: [id number] Date of Birth: 30-Nov-1945   Medicare Important Message Given:  Yes     Memory Argue 03/06/2019, 2:27 PM

## 2019-03-06 NOTE — Progress Notes (Signed)
[facility] reports they still have insurance auth for patient pending his medical readiness to discharge. Patient will need updated COVID test before discharge to SNF, appears this was ordered over the weekend but not done. Requesting SARS rapid COVID test be ordered if possible for timely discharge.   [place], [place]

## 2019-03-06 NOTE — Progress Notes (Signed)
[facility] PROGRESS NOTE  Mark Pham WUJ:[id number] DOB: 26-Feb-1946 DOA: 02/24/2019 PCP: Bailey Mech, PA-C  Brief summary   73 year old male with history of PAF s/p cryoablation in 10/2013 now on aspirin, nonobstructive CAD based on cath from 2012, HTN, asthma, HLD and hypothyroidism admitted with left distal femoral fracture after fall in the pool at home.  Underwent intramedullary nailing on 02/25/2019, approved for SNF rehab point 03/02/2019 but could not be discharged due to persistent and very symptomatic orthostatic hypotension.  A.m. cortisol level 2.6 on 03/03/19.  ACTH stimulation test done on 7/4 with base cortisol of 9.1 and improvement to 20 and 24 at 30 and 60 minutes respectively. Patient was started on oral Solu-Cortef 50 mg twice daily.  Also continued his atenolol and Cardizem and started on metoprolol for A. Fib.  Also noted that patient had a urinalysis and urine culture which was positive for pansensitive Proteus mirabilis.  Started on ceftriaxone. MRI brain obtained on 03/05/2019 without acute finding.  Personally reviewed the image with Dr. Karin Golden who confirmed that there is no pathologic finding of pituitary gland.  Assessment/Plan:  Mechanical fall at home. Left distal femoral fracture. Intramedullary nailing on 02/25/2019. Approved for SNF. Ortho recommended lovenox for DVT prophylaxis for 2 weeks, and f/u in 2 weeks in the office   Orthostatic hypotension: He was not symptomatic.  A.m. cortisol on 7/3 was 2.6.  ACTH stimulation test with base cortisol level of 9.1 that has increased to 20 and 24 at 30 and 60 minutes respectively suggesting intact adrenal glands but questionable secondary adrenal insufficiency.  We do not have baseline ACTH level.  There is also discrepancy between his cortisol level on 7/3 and 7/4.  He is also on 2 nodal blocking agents, atenolol and Cardizem.  Denies recent steroid use.  TSH within normal range.  MRI brain on 7/5 without  significant finding. Started p.o. Solu-Cortef 15 mg twice daily on 7/4. Will taper in 1 week. Suspect recovery of adrenal function. F/u with orthostatics at rehab   Hypertension: Standing blood pressure in normal range. Plan as above  UTI: Patient had some dysuria, and positive UA and urine culture with pansensitive Proteus mirabilis. Received ceftriaxone 7/4--7/6. Transitioned to Keflex on discharge to complete the treatment course   Anemia of chronic disease: Hgb 12.2 on admission.  Stable at 9 after initial drop.   Hypothyroidism: TSH within normal range. 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  On metformin at home.   History of COPD?:  Stable. 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 Called updated his family (indicate person spoken with, relationship, and if by phone, the number) Disposition Plan: SNF   Consultants:  Ortho   Procedures:  Surgery   Antibiotics: Anti-infectives (From admission, onward)   Start     Dose/Rate Route Frequency Ordered Stop   03/04/19 2200  amoxicillin-clavulanate (AUGMENTIN) 875-125 MG per tablet 1 tablet  Status:  Discontinued     1 tablet Oral Every 12 hours 03/04/19 1742 03/04/19 1857   03/04/19 2000  cefTRIAXone (ROCEPHIN) 2 g in sodium chloride 0.9 % 100 mL IVPB     2 g 200 mL/hr over 30 Minutes Intravenous Every 24 hours 03/04/19 1857     02/25/19 1700  ceFAZolin (ANCEF) IVPB 2g/100 mL premix     2 g 200 mL/hr over 30 Minutes Intravenous Every 6 hours 02/25/19 1353 02/26/19 0511   02/25/19 0600  ceFAZolin (ANCEF) IVPB 2g/100 mL premix  2 g 200 mL/hr over 30 Minutes Intravenous On call to O.R. 02/24/19 1846 02/25/19 1103        (indicate start date, and stop date if known)  HPI/Subjective: No acute distress. Had BM./ no acute dizziness. Asking about discharge   Objective: Vitals:   03/06/19 1035 03/06/19 1037  BP: 129/83 (!) 176/134  Pulse: 78 92  Resp: 16   Temp: 98.5 F  (36.9 C)   SpO2: 100% 98%    Intake/Output Summary (Last 24 hours) at 03/06/2019 1127 Last data filed at 03/06/2019 0730 Gross per 24 hour  Intake 500 ml  Output 1500 ml  Net -1000 ml   Filed Weights   02/24/19 1335  Weight: 104.3 kg    Exam:   General:  No distress   Cardiovascular: s1,s2 rrr  Respiratory: CTA BL  Abdomen: soft, nt   Musculoskeletal: no pedal edema    Data Reviewed: Basic Metabolic Panel: Recent Labs  Lab 03/02/19 1143 03/03/19 0216 03/04/19 0829 03/05/19 0819  NA 134* 136 138 140  K 3.7 4.0 3.9 3.6  CL 104 105 108 112*  CO2 20* 22 21* 22  GLUCOSE 135* 95 94 102*  BUN 15 18 12 13   CREATININE 0.96 0.83 0.73 0.73  CALCIUM 8.5* 8.5* 8.5* 8.5*   Liver Function Tests: No results for input(s): AST, ALT, ALKPHOS, BILITOT, PROT, ALBUMIN in the last 168 hours. No results for input(s): LIPASE, AMYLASE in the last 168 hours. No results for input(s): AMMONIA in the last 168 hours. CBC: Recent Labs  Lab 03/02/19 1143 03/03/19 0216 03/04/19 0829  WBC 7.8 6.8 6.3  HGB 9.9* 8.9* 9.2*  HCT 30.8* 28.1* 29.3*  MCV 91.4 93.0 92.7  PLT 176 145* 175   Cardiac Enzymes: No results for input(s): CKTOTAL, CKMB, CKMBINDEX, TROPONINI in the last 168 hours. BNP (last 3 results) No results for input(s): BNP in the last 8760 hours.  ProBNP (last 3 results) No results for input(s): PROBNP in the last 8760 hours.  CBG: No results for input(s): GLUCAP in the last 168 hours.  Recent Results (from the past 240 hour(s))  SARS Coronavirus 2 (CEPHEID - Performed in [facility] hospital lab), Hosp Order     Status: None   Collection Time: 02/24/19  1:47 PM   Specimen: Nasopharyngeal Swab  Result Value Ref Range Status   SARS Coronavirus 2 NEGATIVE NEGATIVE Final    Comment: (NOTE) If result is NEGATIVE SARS-CoV-2 target nucleic acids are NOT DETECTED. The SARS-CoV-2 RNA is generally detectable in upper and lower  respiratory specimens during the acute phase  of infection. The lowest  concentration of SARS-CoV-2 viral copies this assay can detect is 250  copies / mL. A negative result does not preclude SARS-CoV-2 infection  and should not be used as the sole basis for treatment or other  patient management decisions.  A negative result may occur with  improper specimen collection / handling, submission of specimen other  than nasopharyngeal swab, presence of viral mutation(s) within the  areas targeted by this assay, and inadequate number of viral copies  (<250 copies / mL). A negative result must be combined with clinical  observations, patient history, and epidemiological information. If result is POSITIVE SARS-CoV-2 target nucleic acids are DETECTED. The SARS-CoV-2 RNA is generally detectable in upper and lower  respiratory specimens dur ing the acute phase of infection.  Positive  results are indicative of active infection with SARS-CoV-2.  Clinical  correlation with patient history and other  diagnostic information is  necessary to determine patient infection status.  Positive results do  not rule out bacterial infection or co-infection with other viruses. If result is PRESUMPTIVE POSTIVE SARS-CoV-2 nucleic acids MAY BE PRESENT.   A presumptive positive result was obtained on the submitted specimen  and confirmed on repeat testing.  While 2019 novel coronavirus  (SARS-CoV-2) nucleic acids may be present in the submitted sample  additional confirmatory testing may be necessary for epidemiological  and / or clinical management purposes  to differentiate between  SARS-CoV-2 and other Sarbecovirus currently known to infect humans.  If clinically indicated additional testing with an alternate test  methodology [phone]) is advised. The SARS-CoV-2 RNA is generally  detectable in upper and lower respiratory sp ecimens during the acute  phase of infection. The expected result is Negative. Fact Sheet for Patients:   BoilerBrush.com.cyhttps://www.fda.gov/media/136312/download Fact Sheet for Healthcare Providers: https://pope.com/https://www.fda.gov/media/136313/download This test is not yet approved or cleared by the [place] FDA and has been authorized for detection and/or diagnosis of SARS-CoV-2 by FDA under an Emergency Use Authorization (EUA).  This EUA will remain in effect (meaning this test can be used) for the duration of the COVID-19 declaration under Section 564(b)(1) of the Act, 21 U.S.C. section 360bbb-3(b)(1), unless the authorization is terminated or revoked sooner. Performed at [facility], [address] [place], [place] [address]   Surgical pcr screen     Status: None   Collection Time: 02/24/19  6:48 PM   Specimen: Nasal Mucosa; Nasal Swab  Result Value Ref Range Status   MRSA, PCR NEGATIVE NEGATIVE Final   Staphylococcus aureus NEGATIVE NEGATIVE Final    Comment: (NOTE) The Xpert SA Assay (FDA approved for NASAL specimens in patients 73 years of age and older), is one component of a comprehensive surveillance program. It is not intended to diagnose infection nor to guide or monitor treatment. Performed at [facility], [address] [place], [place] [address]   SARS Coronavirus 2 (CEPHEID - Performed in [facility] hospital lab), Hosp Order     Status: None   Collection Time: 03/01/19  4:59 PM   Specimen: Nasopharyngeal Swab  Result Value Ref Range Status   SARS Coronavirus 2 NEGATIVE NEGATIVE Final    Comment: (NOTE) If result is NEGATIVE SARS-CoV-2 target nucleic acids are NOT DETECTED. The SARS-CoV-2 RNA is generally detectable in upper and lower  respiratory specimens during the acute phase of infection. The lowest  concentration of SARS-CoV-2 viral copies this assay can detect is 250  copies / mL. A negative result does not preclude SARS-CoV-2 infection  and should not be used as the sole basis for treatment or other  patient management decisions.  A negative result may occur  with  improper specimen collection / handling, submission of specimen other  than nasopharyngeal swab, presence of viral mutation(s) within the  areas targeted by this assay, and inadequate number of viral copies  (<250 copies / mL). A negative result must be combined with clinical  observations, patient history, and epidemiological information. If result is POSITIVE SARS-CoV-2 target nucleic acids are DETECTED. The SARS-CoV-2 RNA is generally detectable in upper and lower  respiratory specimens dur ing the acute phase of infection.  Positive  results are indicative of active infection with SARS-CoV-2.  Clinical  correlation with patient history and other diagnostic information is  necessary to determine patient infection status.  Positive results do  not rule out bacterial infection or co-infection with other viruses.  If result is PRESUMPTIVE POSTIVE SARS-CoV-2 nucleic acids MAY BE PRESENT.   A presumptive positive result was obtained on the submitted specimen  and confirmed on repeat testing.  While 2019 novel coronavirus  (SARS-CoV-2) nucleic acids may be present in the submitted sample  additional confirmatory testing may be necessary for epidemiological  and / or clinical management purposes  to differentiate between  SARS-CoV-2 and other Sarbecovirus currently known to infect humans.  If clinically indicated additional testing with an alternate test  methodology [phone]) is advised. The SARS-CoV-2 RNA is generally  detectable in upper and lower respiratory sp ecimens during the acute  phase of infection. The expected result is Negative. Fact Sheet for Patients:  BoilerBrush.com.cyhttps://www.fda.gov/media/136312/download Fact Sheet for Healthcare Providers: https://pope.com/https://www.fda.gov/media/136313/download This test is not yet approved or cleared by the [place] FDA and has been authorized for detection and/or diagnosis of SARS-CoV-2 by FDA under an Emergency Use Authorization (EUA).  This EUA  will remain in effect (meaning this test can be used) for the duration of the COVID-19 declaration under Section 564(b)(1) of the Act, 21 U.S.C. section 360bbb-3(b)(1), unless the authorization is terminated or revoked sooner. Performed at [facility], [address] [place], [place] [address]   Culture, Urine     Status: Abnormal   Collection Time: 03/01/19  7:30 PM   Specimen: Urine, Clean Catch  Result Value Ref Range Status   Specimen Description URINE, CLEAN CATCH  Final   Special Requests   Final    NONE Performed at [facility], [address] [place], [place] [address]    Culture >=100,000 COLONIES/mL PROTEUS MIRABILIS (A)  Final   Report Status 03/04/2019 FINAL  Final   Organism ID, Bacteria PROTEUS MIRABILIS (A)  Final      Susceptibility   Proteus mirabilis - MIC*    AMPICILLIN <=2 SENSITIVE Sensitive     CEFAZOLIN 8 SENSITIVE Sensitive     CEFTRIAXONE <=1 SENSITIVE Sensitive     CIPROFLOXACIN <=0.25 SENSITIVE Sensitive     GENTAMICIN <=1 SENSITIVE Sensitive     IMIPENEM 4 SENSITIVE Sensitive     NITROFURANTOIN 128 RESISTANT Resistant     TRIMETH/SULFA <=20 SENSITIVE Sensitive     AMPICILLIN/SULBACTAM <=2 SENSITIVE Sensitive     PIP/TAZO <=4 SENSITIVE Sensitive     * >=100,000 COLONIES/mL PROTEUS MIRABILIS  SARS Coronavirus 2 [facility](Hospital order, Performed in [facility] hospital lab)     Status: None   Collection Time: 03/05/19 12:57 PM  Result Value Ref Range Status   SARS Coronavirus 2 NEGATIVE NEGATIVE Final    Comment: (NOTE) If result is NEGATIVE SARS-CoV-2 target nucleic acids are NOT DETECTED. The SARS-CoV-2 RNA is generally detectable in upper and lower  respiratory specimens during the acute phase of infection. The lowest  concentration of SARS-CoV-2 viral copies this assay can detect is 250  copies / mL. A negative result does not preclude SARS-CoV-2 infection  and should not be used as the sole basis for treatment or other  patient  management decisions.  A negative result may occur with  improper specimen collection / handling, submission of specimen other  than nasopharyngeal swab, presence of viral mutation(s) within the  areas targeted by this assay, and inadequate number of viral copies  (<250 copies / mL). A negative result must be combined with clinical  observations, patient history, and epidemiological information. If result is POSITIVE SARS-CoV-2 target nucleic acids are DETECTED. The SARS-CoV-2 RNA is generally detectable in upper and  lower  respiratory specimens dur ing the acute phase of infection.  Positive  results are indicative of active infection with SARS-CoV-2.  Clinical  correlation with patient history and other diagnostic information is  necessary to determine patient infection status.  Positive results do  not rule out bacterial infection or co-infection with other viruses. If result is PRESUMPTIVE POSTIVE SARS-CoV-2 nucleic acids MAY BE PRESENT.   A presumptive positive result was obtained on the submitted specimen  and confirmed on repeat testing.  While 2019 novel coronavirus  (SARS-CoV-2) nucleic acids may be present in the submitted sample  additional confirmatory testing may be necessary for epidemiological  and / or clinical management purposes  to differentiate between  SARS-CoV-2 and other Sarbecovirus currently known to infect humans.  If clinically indicated additional testing with an alternate test  methodology [phone]) is advised. The SARS-CoV-2 RNA is generally  detectable in upper and lower respiratory sp ecimens during the acute  phase of infection. The expected result is Negative. Fact Sheet for Patients:  StrictlyIdeas.no Fact Sheet for Healthcare Providers: BankingDealers.co.za This test is not yet approved or cleared by the [place] FDA and has been authorized for detection and/or diagnosis of SARS-CoV-2 by FDA under  an Emergency Use Authorization (EUA).  This EUA will remain in effect (meaning this test can be used) for the duration of the COVID-19 declaration under Section 564(b)(1) of the Act, 21 U.S.C. section 360bbb-3(b)(1), unless the authorization is terminated or revoked sooner. Performed at [facility], [place] [address] [place], [place] [address]      Studies: Mr Jeri Cos WJ Contrast  Result Date: 03/05/2019 CLINICAL DATA:  Postoperative hypotension and dizziness. EXAM: MRI HEAD WITHOUT AND WITH CONTRAST TECHNIQUE: Multiplanar, multiecho pulse sequences of the brain and surrounding structures were obtained without and with intravenous contrast. CONTRAST:  10 cc Gadavist COMPARISON:  None. FINDINGS: Brain: Diffusion imaging does not show any acute or subacute infarction. There is mild generalized age related atrophy. No focal abnormality is seen affecting the brainstem or cerebellum. Cerebral hemispheres show moderate chronic small-vessel ischemic changes of the white matter. No cortical or large vessel territory infarction. No mass lesion, hemorrhage, hydrocephalus or extra-axial collection. Vascular: Major vessels at the base of the brain show flow. Skull and upper cervical spine: Negative Sinuses/Orbits: Clear/normal Other: None IMPRESSION: No acute or specific finding by MRI. Age related atrophy. Moderate chronic small-vessel ischemic changes of the cerebral hemispheric white matter. Electronically Signed   By: Nelson Chimes M.D.   On: 03/05/2019 12:25    Scheduled Meds: . aspirin  81 mg Oral Daily  . colesevelam  1,875 mg Oral BID WC  . enoxaparin (LOVENOX) injection  40 mg Subcutaneous Q24H  . febuxostat  40 mg Oral BID  . ferrous sulfate  325 mg Oral Q breakfast  . levothyroxine  75 mcg Oral QAC breakfast  . losartan  50 mg Oral Daily  . metoprolol tartrate  50 mg Oral BID  . mometasone-formoterol  2 puff Inhalation BID  . senna-docusate  2 tablet Oral BID   Continuous Infusions: .  sodium chloride 40 mL/hr at 03/04/19 1350  . cefTRIAXone (ROCEPHIN)  IV 2 g (03/05/19 2142)  . methocarbamol (ROBAXIN) IV      Active Problems:   Closed fracture of left distal femur (HCC)   Hypothyroidism   Type 2 diabetes mellitus without complication (HCC)   Normocytic anemia   Paroxysmal atrial fibrillation (HCC)   Orthostatic hypotension    Time  spent: >35 minutes     Esperanza Sheets  [facility] Pager [phone]. If 7PM-7AM, please contact night-coverage at [email or web address], password [email or web address] 03/06/2019, 11:27 AM  LOS: 10 days

## 2019-03-06 NOTE — Discharge Summary (Signed)
Physician Discharge Summary  Mark LoanKenneth Henson WUJ:[id number] DOB: 1946-03-18 DOA: 02/24/2019  PCP: Bailey MechPodraza, Cole Christopher, PA-C  Admit date: 02/24/2019 Discharge date: 03/06/2019  Time spent: >35 minutes  Recommendations for Outpatient Follow-up:   f/u with orthopedics in 2 week s Terance HartAdair, Christopher R, MD     F/u with MD  At the facility. 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 Underwent intramedullary nailing on 02/25/2019, approved for SNF rehab point 03/02/2019 but could not be discharged due to persistent and very symptomatic orthostatic hypotension. A.m. cortisol level 2.6 on 03/03/19. ACTH stimulation test done on 7/4 with base cortisol of 9.1 and improvement to 20 and 24 at 30 and 60 minutes respectively. Patient was started on oral Solu-Cortef 50 mg twice daily.Also continued his atenolol and Cardizem and started on metoprolol for A. Fib. Also noted that patient had a urinalysis and urine culture which was positive for pansensitive Proteus mirabilis.Started on ceftriaxone. MRI brain obtained on 03/05/2019 without acute finding. Personally reviewed the image with Dr. Karin GoldenShogry who confirmed that there is nopathologic finding of pituitary gland.  Hospital Course:   Mechanical fall at home. Left distal femoral fracture. Intramedullary nailing on 02/25/2019. Approved for SNF. Ortho  recommended lovenox for DVT prophylaxis for 2 weeks, and f/u in 2 weeks in the office   Orthostatic hypotension: He was not symptomatic. A.m. cortisol on 7/3 was 2.6. ACTH stimulation test with base cortisol level of 9.1 that has increased to 20 and 24 at 30 and 60 minutes respectively suggesting intact adrenal glands but questionable secondary adrenal insufficiency. We do not have baseline ACTH level. There is alsodiscrepancy between his cortisol level on 7/3 and 7/4. TSH within normal range. MRI brain on7/5 without significant finding. Startedp.o. Solu-Cortef low dose for 1 week. Suspect recovery of adrenal function. F/u with orthostatics at rehab. discontinued tizanidine    Hypertension:Standing blood pressure in normal range. Plan as above  UTI: Patient had some dysuria, and positive UA and urine culture with pansensitive Proteus mirabilis. Received ceftriaxone 7/4--7/6. Transitioned to Keflex on discharge to complete the treatment course   Anemia of chronic disease: Hgb 12.2 on admission.Stable at 9 after initial drop.   Hypothyroidism:TSH within normal range. 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 On metformin at home.   History of COPD?: Stable. Continue home med  Procedures:  Hip surgery  (i.e. Studies not automatically included, echos, thoracentesis, etc; not x-rays)  Consultations:  Orthopedics   Discharge Exam: Vitals:   03/06/19 1035 03/06/19 1037  BP: 129/83 (!) 176/134  Pulse: 78 92  Resp: 16   Temp: 98.5 F (36.9 C)   SpO2: 100% 98%    General: no acute distress  Cardiovascular: s1,s2 rrr Respiratory: CTA BL  Discharge Instructions  Discharge Instructions    Diet - low sodium heart healthy   Complete by: As directed    Discharge instructions   Complete by:  As directed    Please f/u with  Terance HartAdair, Christopher R, MD in 2 weeks   Increase activity slowly   Complete by: As directed      Allergies as of 03/06/2019      Reactions    Ace Inhibitors Shortness Of Breath, Cough   Statins Shortness Of Breath, Other (See Comments)   Muscle pain, also   Latanoprost Other (See Comments)   Headaches   Allopurinol Itching, Rash      Medication List    STOP taking these medications   atenolol 25 MG tablet Commonly known as: TENORMIN   diltiazem 240 MG 24 hr capsule Commonly known as: CARDIZEM CD   fluticasone 50 MCG/ACT nasal spray Commonly known as: FLONASE   sildenafil 50 MG tablet Commonly known as: VIAGRA     TAKE these medications   albuterol 108 (90 Base) MCG/ACT inhaler Commonly known as: VENTOLIN HFA Inhale 2 puffs into the lungs every 4 (four) hours as needed for wheezing or shortness of breath.   albuterol (2.5 MG/3ML) 0.083% nebulizer solution Commonly known as: PROVENTIL Take 2.5 mg by nebulization every 6 (six) hours as needed for wheezing or shortness of breath.   aspirin 81 MG chewable tablet Chew 81 mg by mouth daily.   CALCIUM PO Take 1 tablet by mouth daily with breakfast.   cephALEXin 500 MG capsule Commonly known as: KEFLEX Take 1 capsule (500 mg total) by mouth 4 (four) times daily for 3 days.   colchicine 0.6 MG tablet Take 0.6 mg by mouth daily as needed (for gout flares- as directed).   colesevelam 625 MG tablet Commonly known as: WELCHOL Take 1,875 mg by mouth 2 (two) times daily with a meal.   enoxaparin 40 MG/0.4ML injection Commonly known as: LOVENOX Inject 0.4 mLs (40 mg total) into the skin daily for 14 days.   febuxostat 40 MG tablet Commonly known as: ULORIC Take 40 mg by mouth 2 (two) times a day.   ferrous sulfate 325 (65 FE) MG tablet Take 325 mg by mouth daily with breakfast.   gabapentin 300 MG capsule Commonly known as: NEURONTIN Take 300 mg by mouth 2 (two) times daily as needed (for nerve pain).   hydrocortisone 5 MG tablet Commonly known as: CORTEF Take 1 tablet (5 mg total) by mouth daily.   levothyroxine 75 MCG tablet Commonly known as:  SYNTHROID Take 75 mcg by mouth daily before breakfast.   losartan 100 MG tablet Commonly known as: COZAAR Take 100 mg by mouth daily.   metFORMIN 500 MG tablet Commonly known as: GLUCOPHAGE Take 500 mg by mouth 2 (two) times daily with a meal.   metoprolol tartrate 50 MG tablet Commonly known as: LOPRESSOR Take 1 tablet (50 mg total) by mouth 2 (two) times daily.   montelukast 10 MG tablet Commonly known as: SINGULAIR Take 10 mg by mouth at bedtime.   senna-docusate 8.6-50 MG tablet Commonly known as: Senokot-S Take 2 tablets by mouth 2 (two) times daily.   Symbicort 160-4.5 MCG/ACT inhaler Generic drug: budesonide-formoterol Inhale 2 puffs into the lungs every morning.   vitamin B-12 1000 MCG tablet Commonly known as: CYANOCOBALAMIN Take 1,000 mcg by mouth daily.   Vitamin D3 Super Strength 50 MCG (2000 UT) Caps Generic drug: Cholecalciferol Take 2,000 Units by mouth at bedtime.     ASK your doctor about these medications   HYDROcodone-acetaminophen 5-325 MG tablet Commonly known as: NORCO/VICODIN Take 1-2 tablets by mouth every 4 (four) hours as needed  for up to 7 days for moderate pain. Ask about: Should I take this medication?      Allergies  Allergen Reactions  . Ace Inhibitors Shortness Of Breath and Cough  . Statins Shortness Of Breath and Other (See Comments)    Muscle pain, also   . Latanoprost Other (See Comments)    Headaches   . Allopurinol Itching and Rash   Follow-up Information    Terance HartAdair, Christopher R, MD In 2 weeks.   Specialty: Orthopedic Surgery Contact information: [address] [place] [id number] [phone]            The results of significant diagnostics from this hospitalization (including imaging, microbiology, ancillary and laboratory) are listed below for reference.    Significant Diagnostic Studies: Mr Laqueta JeanBrain W BJWo Contrast  Result Date: 03/05/2019 CLINICAL DATA:  Postoperative hypotension and dizziness. EXAM: MRI  HEAD WITHOUT AND WITH CONTRAST TECHNIQUE: Multiplanar, multiecho pulse sequences of the brain and surrounding structures were obtained without and with intravenous contrast. CONTRAST:  10 cc Gadavist COMPARISON:  None. FINDINGS: Brain: Diffusion imaging does not show any acute or subacute infarction. There is mild generalized age related atrophy. No focal abnormality is seen affecting the brainstem or cerebellum. Cerebral hemispheres show moderate chronic small-vessel ischemic changes of the white matter. No cortical or large vessel territory infarction. No mass lesion, hemorrhage, hydrocephalus or extra-axial collection. Vascular: Major vessels at the base of the brain show flow. Skull and upper cervical spine: Negative Sinuses/Orbits: Clear/normal Other: None IMPRESSION: No acute or specific finding by MRI. Age related atrophy. Moderate chronic small-vessel ischemic changes of the cerebral hemispheric white matter. Electronically Signed   By: Paulina FusiMark  Shogry M.D.   On: 03/05/2019 12:25   Dg Chest Port 1 View  Result Date: 02/24/2019 CLINICAL DATA:  Femur fracture EXAM: PORTABLE CHEST 1 VIEW COMPARISON:  11/11/2018, 11/09/2018 FINDINGS: Surgical hardware in the cervical spine. Cardiomegaly. No focal opacity or pleural effusion. No pneumothorax. IMPRESSION: No active disease.  Mild cardiomegaly Electronically Signed   By: Jasmine PangKim  Fujinaga M.D.   On: 02/24/2019 18:51   Dg Tibia/fibula Left Port  Result Date: 02/24/2019 CLINICAL DATA:  Femur fracture EXAM: PORTABLE LEFT TIBIA AND FIBULA - 2 VIEW COMPARISON:  01/26/2005 FINDINGS: Left knee replacement. Oblique lucency in the distal shaft and metaphysis of the femur extending towards the femoral component. No fracture or malalignment of the tibia or fibula. IMPRESSION: 1. Left knee replacement. No acute osseous abnormality of left tibia or fibula. 2. Suspected linear fracture lucency within the distal shaft and metaphysis of the femur extending toward the femoral  component Electronically Signed   By: Jasmine PangKim  Fujinaga M.D.   On: 02/24/2019 18:51   Dg C-arm 1-60 Min  Result Date: 02/25/2019 CLINICAL DATA:  Left femoral shaft fracture. EXAM: LEFT FEMUR 2 VIEWS; DG C-ARM 61-120 MIN COMPARISON:  Radiographs dated 02/24/2019 FINDINGS: Multiple C-arm images demonstrate the patient undergoing insertion of intramedullary nail in the left femur with proximal and distal fixation screws. The hardware appears in excellent position. Marked improvement in alignment and position of the fracture fragments. Left total knee prosthesis noted. IMPRESSION: Open reduction and internal fixation of distal left femoral shaft fracture. Near anatomic alignment and position. FLUOROSCOPY TIME:  2 minutes 54 seconds C-arm fluoroscopic images were obtained intraoperatively and submitted for post operative interpretation. Electronically Signed   By: Francene BoyersJames  Maxwell M.D.   On: 02/25/2019 14:36   Dg Femur Portable 1 View Left  Result Date: 02/24/2019 CLINICAL DATA:  Larey SeatFell.  Left distal leg pain and swelling. EXAM: LEFT FEMUR PORTABLE 1 VIEW COMPARISON:  None. FINDINGS: There is a spiral type fracture of the distal femoral shaft at the middle third distal third junction region, well above the femoral prosthesis. There is approximately 1 cortex width of displacement in the single plane. IMPRESSION: Spiral type fracture of the distal femoral shaft. Electronically Signed   By: Rudie Meyer M.D.   On: 02/24/2019 15:00   Dg Femur Min 2 Views Left  Result Date: 02/25/2019 CLINICAL DATA:  Left femoral shaft fracture. EXAM: LEFT FEMUR 2 VIEWS; DG C-ARM 61-120 MIN COMPARISON:  Radiographs dated 02/24/2019 FINDINGS: Multiple C-arm images demonstrate the patient undergoing insertion of intramedullary nail in the left femur with proximal and distal fixation screws. The hardware appears in excellent position. Marked improvement in alignment and position of the fracture fragments. Left total knee prosthesis noted.  IMPRESSION: Open reduction and internal fixation of distal left femoral shaft fracture. Near anatomic alignment and position. FLUOROSCOPY TIME:  2 minutes 54 seconds C-arm fluoroscopic images were obtained intraoperatively and submitted for post operative interpretation. Electronically Signed   By: Francene Boyers M.D.   On: 02/25/2019 14:36   Dg Femur Port Min 2 Views Left  Result Date: 02/24/2019 CLINICAL DATA:  Femur fracture EXAM: LEFT FEMUR PORTABLE 2 VIEWS COMPARISON:  02/24/2019 FINDINGS: Prior left knee replacement. Acute spiral fracture distal shaft of the femur with slightly greater than 1/2 bone with of anterior and 1/4 bone with of medial displacement of distal fracture fragment. Suspected linear fracture lucency extending to the metaphyseal region of the femur towards the edge of the femoral prosthesis. IMPRESSION: Acute displaced fracture involving the distal femoral shaft with suspected linear fracture lucency extending to the metaphyseal region of the distal femur, toward the femoral component of the knee replacement. Electronically Signed   By: Jasmine Pang M.D.   On: 02/24/2019 18:49    Microbiology: Recent Results (from the past 240 hour(s))  SARS Coronavirus 2 (CEPHEID - Performed in [facility] hospital lab), Hosp Order     Status: None   Collection Time: 02/24/19  1:47 PM   Specimen: Nasopharyngeal Swab  Result Value Ref Range Status   SARS Coronavirus 2 NEGATIVE NEGATIVE Final    Comment: (NOTE) If result is NEGATIVE SARS-CoV-2 target nucleic acids are NOT DETECTED. The SARS-CoV-2 RNA is generally detectable in upper and lower  respiratory specimens during the acute phase of infection. The lowest  concentration of SARS-CoV-2 viral copies this assay can detect is 250  copies / mL. A negative result does not preclude SARS-CoV-2 infection  and should not be used as the sole basis for treatment or other  patient management decisions.  A negative result may occur with   improper specimen collection / handling, submission of specimen other  than nasopharyngeal swab, presence of viral mutation(s) within the  areas targeted by this assay, and inadequate number of viral copies  (<250 copies / mL). A negative result must be combined with clinical  observations, patient history, and epidemiological information. If result is POSITIVE SARS-CoV-2 target nucleic acids are DETECTED. The SARS-CoV-2 RNA is generally detectable in upper and lower  respiratory specimens dur ing the acute phase of infection.  Positive  results are indicative of active infection with SARS-CoV-2.  Clinical  correlation with patient history and other diagnostic information is  necessary to determine patient infection status.  Positive results do  not rule out bacterial infection or co-infection with other viruses. If result  is PRESUMPTIVE POSTIVE SARS-CoV-2 nucleic acids MAY BE PRESENT.   A presumptive positive result was obtained on the submitted specimen  and confirmed on repeat testing.  While 2019 novel coronavirus  (SARS-CoV-2) nucleic acids may be present in the submitted sample  additional confirmatory testing may be necessary for epidemiological  and / or clinical management purposes  to differentiate between  SARS-CoV-2 and other Sarbecovirus currently known to infect humans.  If clinically indicated additional testing with an alternate test  methodology [phone]) is advised. The SARS-CoV-2 RNA is generally  detectable in upper and lower respiratory sp ecimens during the acute  phase of infection. The expected result is Negative. Fact Sheet for Patients:  StrictlyIdeas.no Fact Sheet for Healthcare Providers: BankingDealers.co.za This test is not yet approved or cleared by the [place] FDA and has been authorized for detection and/or diagnosis of SARS-CoV-2 by FDA under an Emergency Use Authorization (EUA).  This EUA will  remain in effect (meaning this test can be used) for the duration of the COVID-19 declaration under Section 564(b)(1) of the Act, 21 U.S.C. section 360bbb-3(b)(1), unless the authorization is terminated or revoked sooner. Performed at [facility], [place] [address], [place], [place] [address]   Surgical pcr screen     Status: None   Collection Time: 02/24/19  6:48 PM   Specimen: Nasal Mucosa; Nasal Swab  Result Value Ref Range Status   MRSA, PCR NEGATIVE NEGATIVE Final   Staphylococcus aureus NEGATIVE NEGATIVE Final    Comment: (NOTE) The Xpert SA Assay (FDA approved for NASAL specimens in patients 68 years of age and older), is one component of a comprehensive surveillance program. It is not intended to diagnose infection nor to guide or monitor treatment. Performed at [facility], [place] [address], [place], [place] [address]   SARS Coronavirus 2 (CEPHEID - Performed in [facility] hospital lab), Hosp Order     Status: None   Collection Time: 03/01/19  4:59 PM   Specimen: Nasopharyngeal Swab  Result Value Ref Range Status   SARS Coronavirus 2 NEGATIVE NEGATIVE Final    Comment: (NOTE) If result is NEGATIVE SARS-CoV-2 target nucleic acids are NOT DETECTED. The SARS-CoV-2 RNA is generally detectable in upper and lower  respiratory specimens during the acute phase of infection. The lowest  concentration of SARS-CoV-2 viral copies this assay can detect is 250  copies / mL. A negative result does not preclude SARS-CoV-2 infection  and should not be used as the sole basis for treatment or other  patient management decisions.  A negative result may occur with  improper specimen collection / handling, submission of specimen other  than nasopharyngeal swab, presence of viral mutation(s) within the  areas targeted by this assay, and inadequate number of viral copies  (<250 copies / mL). A negative result must be combined with clinical  observations, patient history, and  epidemiological information. If result is POSITIVE SARS-CoV-2 target nucleic acids are DETECTED. The SARS-CoV-2 RNA is generally detectable in upper and lower  respiratory specimens dur ing the acute phase of infection.  Positive  results are indicative of active infection with SARS-CoV-2.  Clinical  correlation with patient history and other diagnostic information is  necessary to determine patient infection status.  Positive results do  not rule out bacterial infection or co-infection with other viruses. If result is PRESUMPTIVE POSTIVE SARS-CoV-2 nucleic acids MAY BE PRESENT.   A presumptive positive result was obtained on the submitted specimen  and confirmed on  repeat testing.  While 2019 novel coronavirus  (SARS-CoV-2) nucleic acids may be present in the submitted sample  additional confirmatory testing may be necessary for epidemiological  and / or clinical management purposes  to differentiate between  SARS-CoV-2 and other Sarbecovirus currently known to infect humans.  If clinically indicated additional testing with an alternate test  methodology [phone]) is advised. The SARS-CoV-2 RNA is generally  detectable in upper and lower respiratory sp ecimens during the acute  phase of infection. The expected result is Negative. Fact Sheet for Patients:  BoilerBrush.com.cy Fact Sheet for Healthcare Providers: https://pope.com/ This test is not yet approved or cleared by the [place] FDA and has been authorized for detection and/or diagnosis of SARS-CoV-2 by FDA under an Emergency Use Authorization (EUA).  This EUA will remain in effect (meaning this test can be used) for the duration of the COVID-19 declaration under Section 564(b)(1) of the Act, 21 U.S.C. section 360bbb-3(b)(1), unless the authorization is terminated or revoked sooner. Performed at [facility], [address] [place], [place] [address]   Culture,  Urine     Status: Abnormal   Collection Time: 03/01/19  7:30 PM   Specimen: Urine, Clean Catch  Result Value Ref Range Status   Specimen Description URINE, CLEAN CATCH  Final   Special Requests   Final    NONE Performed at [facility], [address] [place], [place] [address]    Culture >=100,000 COLONIES/mL PROTEUS MIRABILIS (A)  Final   Report Status 03/04/2019 FINAL  Final   Organism ID, Bacteria PROTEUS MIRABILIS (A)  Final      Susceptibility   Proteus mirabilis - MIC*    AMPICILLIN <=2 SENSITIVE Sensitive     CEFAZOLIN 8 SENSITIVE Sensitive     CEFTRIAXONE <=1 SENSITIVE Sensitive     CIPROFLOXACIN <=0.25 SENSITIVE Sensitive     GENTAMICIN <=1 SENSITIVE Sensitive     IMIPENEM 4 SENSITIVE Sensitive     NITROFURANTOIN 128 RESISTANT Resistant     TRIMETH/SULFA <=20 SENSITIVE Sensitive     AMPICILLIN/SULBACTAM <=2 SENSITIVE Sensitive     PIP/TAZO <=4 SENSITIVE Sensitive     * >=100,000 COLONIES/mL PROTEUS MIRABILIS  SARS Coronavirus 2 [facility] order, Performed in [facility] hospital lab)     Status: None   Collection Time: 03/05/19 12:57 PM  Result Value Ref Range Status   SARS Coronavirus 2 NEGATIVE NEGATIVE Final    Comment: (NOTE) If result is NEGATIVE SARS-CoV-2 target nucleic acids are NOT DETECTED. The SARS-CoV-2 RNA is generally detectable in upper and lower  respiratory specimens during the acute phase of infection. The lowest  concentration of SARS-CoV-2 viral copies this assay can detect is 250  copies / mL. A negative result does not preclude SARS-CoV-2 infection  and should not be used as the sole basis for treatment or other  patient management decisions.  A negative result may occur with  improper specimen collection / handling, submission of specimen other  than nasopharyngeal swab, presence of viral mutation(s) within the  areas targeted by this assay, and inadequate number of viral copies  (<250 copies / mL). A negative result must be combined  with clinical  observations, patient history, and epidemiological information. If result is POSITIVE SARS-CoV-2 target nucleic acids are DETECTED. The SARS-CoV-2 RNA is generally detectable in upper and lower  respiratory specimens dur ing the acute phase of infection.  Positive  results are indicative of active infection with SARS-CoV-2.  Clinical  correlation with  patient history and other diagnostic information is  necessary to determine patient infection status.  Positive results do  not rule out bacterial infection or co-infection with other viruses. If result is PRESUMPTIVE POSTIVE SARS-CoV-2 nucleic acids MAY BE PRESENT.   A presumptive positive result was obtained on the submitted specimen  and confirmed on repeat testing.  While 2019 novel coronavirus  (SARS-CoV-2) nucleic acids may be present in the submitted sample  additional confirmatory testing may be necessary for epidemiological  and / or clinical management purposes  to differentiate between  SARS-CoV-2 and other Sarbecovirus currently known to infect humans.  If clinically indicated additional testing with an alternate test  methodology [phone]) is advised. The SARS-CoV-2 RNA is generally  detectable in upper and lower respiratory sp ecimens during the acute  phase of infection. The expected result is Negative. Fact Sheet for Patients:  BoilerBrush.com.cy Fact Sheet for Healthcare Providers: https://pope.com/ This test is not yet approved or cleared by the [place] FDA and has been authorized for detection and/or diagnosis of SARS-CoV-2 by FDA under an Emergency Use Authorization (EUA).  This EUA will remain in effect (meaning this test can be used) for the duration of the COVID-19 declaration under Section 564(b)(1) of the Act, 21 U.S.C. section 360bbb-3(b)(1), unless the authorization is terminated or revoked sooner. Performed at [facility], [address], [place], [place] [address]      Labs: Basic Metabolic Panel: Recent Labs  Lab 03/02/19 1143 03/03/19 0216 03/04/19 0829 03/05/19 0819  NA 134* 136 138 140  K 3.7 4.0 3.9 3.6  CL 104 105 108 112*  CO2 20* 22 21* 22  GLUCOSE 135* 95 94 102*  BUN CREATININE 0.96 0.83 0.73 0.73  CALCIUM 8.5* 8.5* 8.5* 8.5*   Liver Function Tests: No results for input(s): AST, ALT, ALKPHOS, BILITOT, PROT, ALBUMIN in the last 168 hours. No results for input(s): LIPASE, AMYLASE in the last 168 hours. No results for input(s): AMMONIA in the last 168 hours. CBC: Recent Labs  Lab 03/02/19 1143 03/03/19 0216 03/04/19 0829  WBC 7.8 6.8 6.3  HGB 9.9* 8.9* 9.2*  HCT 30.8* 28.1* 29.3*  MCV 91.4 93.0 92.7  PLT 176 145* 175   Cardiac Enzymes: No results for input(s): CKTOTAL, CKMB, CKMBINDEX, TROPONINI in the last 168 hours. BNP: BNP (last 3 results) No results for input(s): BNP in the last 8760 hours.  ProBNP (last 3 results) No results for input(s): PROBNP in the last 8760 hours.  CBG: No results for input(s): GLUCAP in the last 168 hours.     SignedEsperanza Sheets  [facility] 03/06/2019, 11:45 AM

## 2019-08-10 ENCOUNTER — Other Ambulatory Visit: Payer: Self-pay | Admitting: Orthopedic Surgery

## 2019-08-10 DIAGNOSIS — T84033A Mechanical loosening of internal left knee prosthetic joint, initial encounter: Secondary | ICD-10-CM

## 2019-08-15 ENCOUNTER — Encounter (HOSPITAL_COMMUNITY): Payer: Medicare Other

## 2019-08-18 ENCOUNTER — Encounter (HOSPITAL_COMMUNITY)
Admission: RE | Admit: 2019-08-18 | Discharge: 2019-08-18 | Disposition: A | Discharge status: Home / Self Care | Payer: Medicare Other | Source: Ambulatory Visit | Attending: Orthopedic Surgery | Admitting: Orthopedic Surgery

## 2019-08-18 ENCOUNTER — Other Ambulatory Visit: Payer: Self-pay

## 2019-08-18 DIAGNOSIS — T84033A Mechanical loosening of internal left knee prosthetic joint, initial encounter: Secondary | ICD-10-CM | POA: Diagnosis present

## 2019-08-18 MED ORDER — TECHNETIUM TC 99M MEDRONATE IV KIT
21.0000 | PACK | Freq: Once | INTRAVENOUS | Status: AC | PRN
  Administered 2019-08-18: 21 via INTRAVENOUS

## 2019-09-13 ENCOUNTER — Other Ambulatory Visit: Payer: Self-pay | Admitting: Orthopedic Surgery

## 2019-09-13 DIAGNOSIS — S72402K Unspecified fracture of lower end of left femur, subsequent encounter for closed fracture with nonunion: Secondary | ICD-10-CM

## 2019-09-19 ENCOUNTER — Other Ambulatory Visit: Payer: Medicare Other

## 2019-10-03 ENCOUNTER — Ambulatory Visit
Admission: RE | Admit: 2019-10-03 | Discharge: 2019-10-03 | Disposition: A | Discharge status: Home / Self Care | Payer: Medicare PPO | Source: Ambulatory Visit | Attending: Orthopedic Surgery | Admitting: Orthopedic Surgery

## 2019-10-03 DIAGNOSIS — S72402K Unspecified fracture of lower end of left femur, subsequent encounter for closed fracture with nonunion: Secondary | ICD-10-CM

## 2019-11-20 LAB — ALDOSTERONE + RENIN ACTIVITY W/ RATIO
ALDO / PRA Ratio: 58 — ABNORMAL HIGH (ref 0.0–30.0)
Aldosterone: 17.8 ng/dL (ref 0.0–30.0)
PRA LC/MS/MS: 0.307 ng/mL/hr (ref 0.167–5.380)

## 2019-11-20 LAB — ACTH

## 2020-08-12 IMAGING — DX LEFT FEMUR PORTABLE 1 VIEW
2 series · 2 of 2 positions shown · non-contrast
Comparison: None.

CLINICAL DATA: Fell.  Left distal leg pain and swelling.

EXAM:
LEFT FEMUR PORTABLE 1 VIEW

[femur ap (1 of 2)]
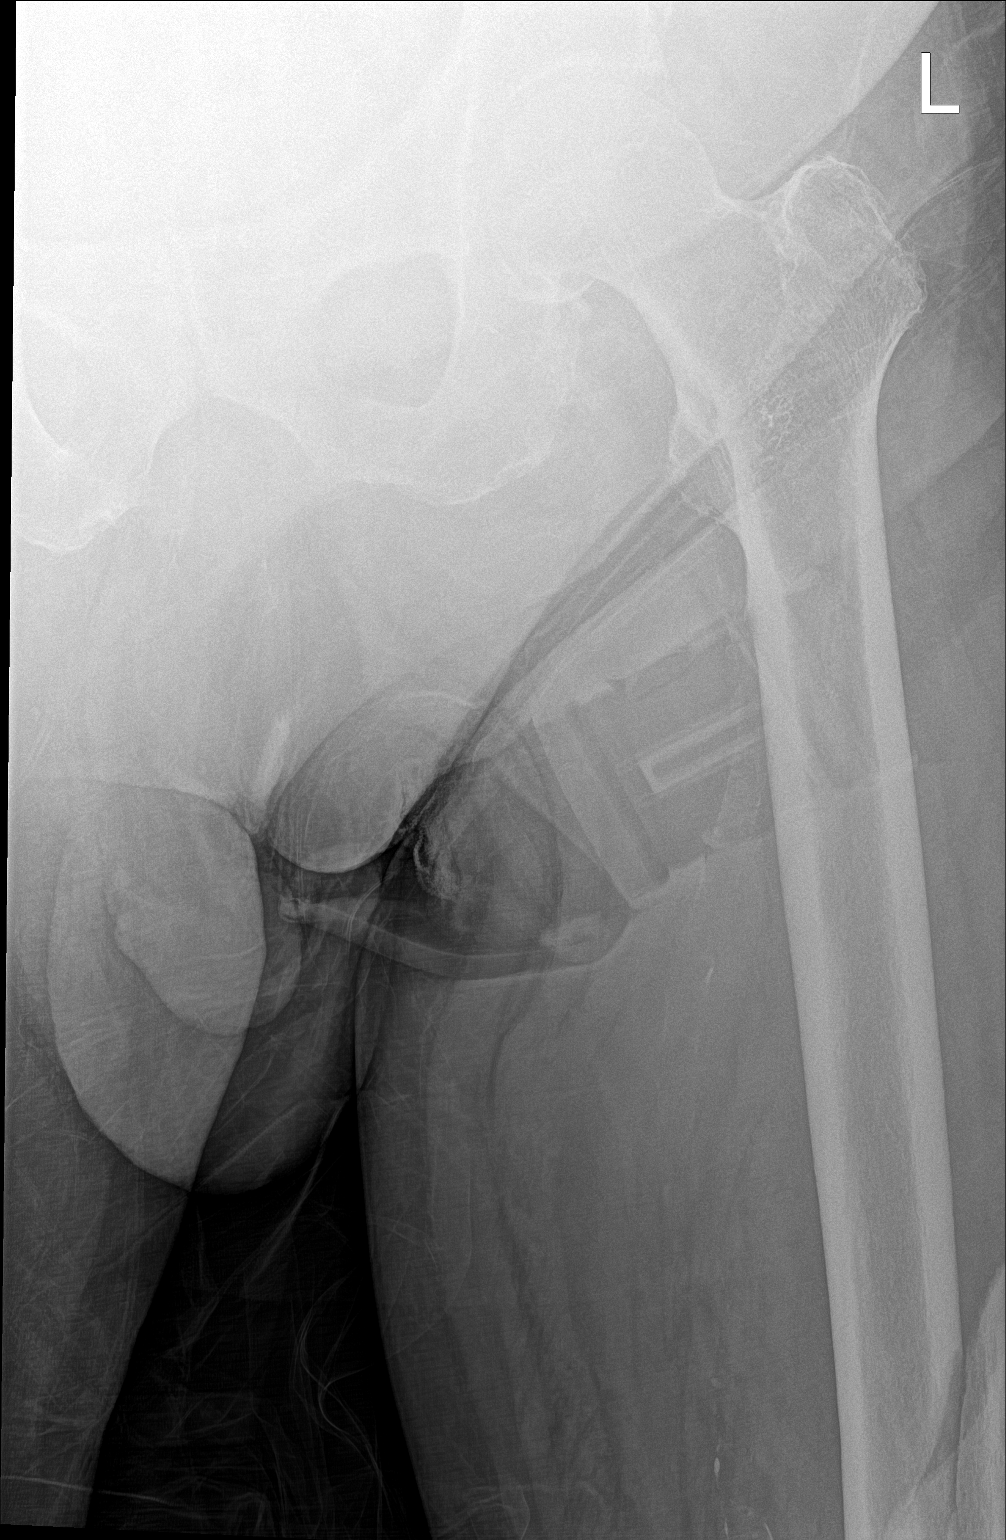

[femur ap (2 of 2)]
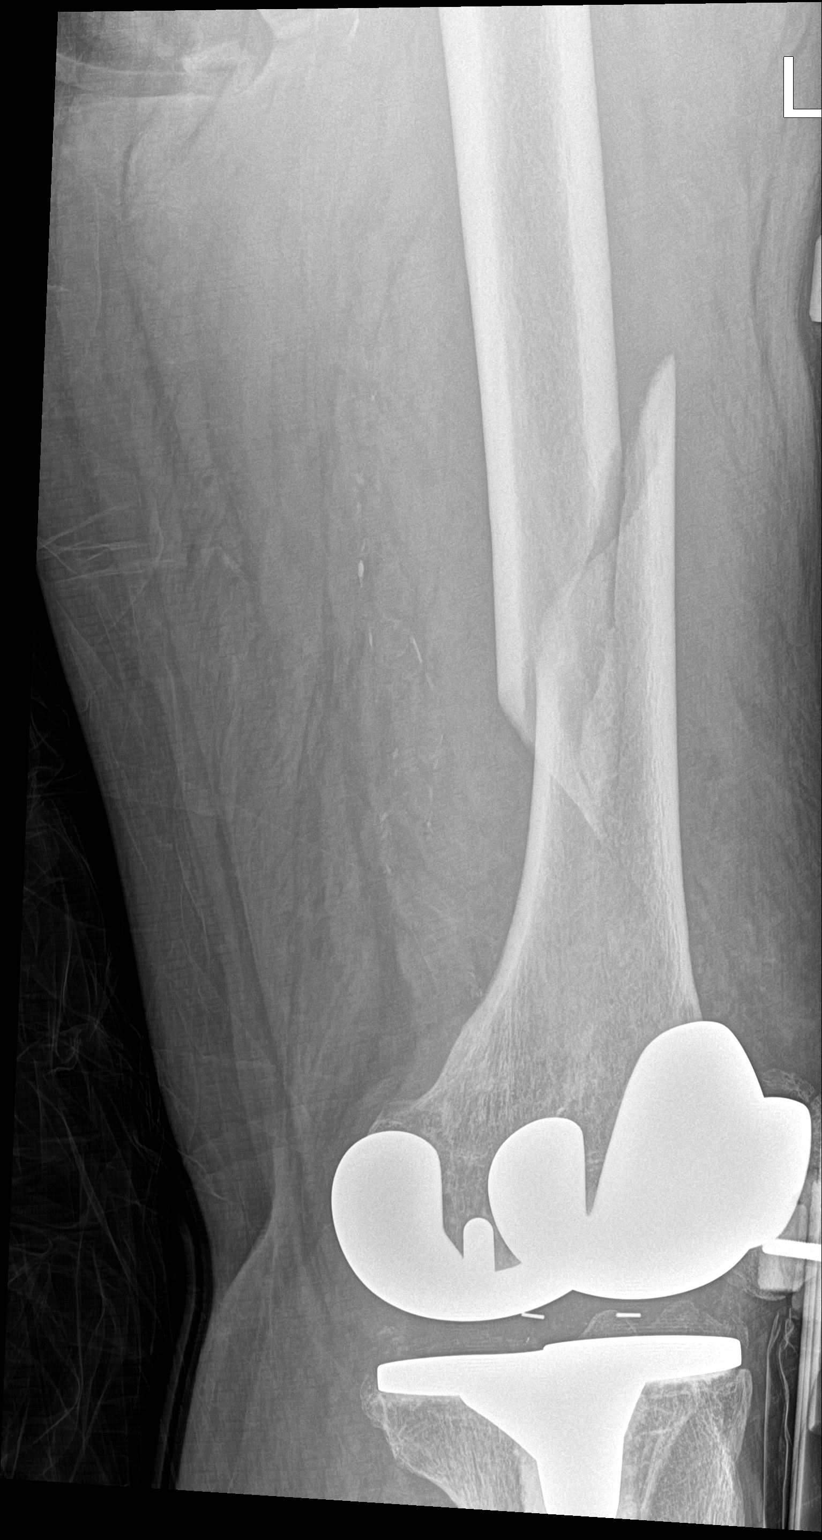

[2 of 2 positions shown; findings below may reference images not displayed]

FINDINGS: There is a spiral type fracture of the distal femoral shaft at the
middle third distal third junction region, well above the femoral
prosthesis. There is approximately 1 cortex width of displacement in
the single plane.
IMPRESSION: Spiral type fracture of the distal femoral shaft.

## 2020-08-13 IMAGING — RF DG C-ARM 61-120 MIN
1 series · 8 of 8 positions shown · non-contrast
Comparison: Radiographs dated 02/24/2019

CLINICAL DATA: Left femoral shaft fracture.

EXAM:
LEFT FEMUR 2 VIEWS; DG C-ARM 61-120 MIN

[Series 1: run · 8 of 8 slices shown]
[im 1/8]
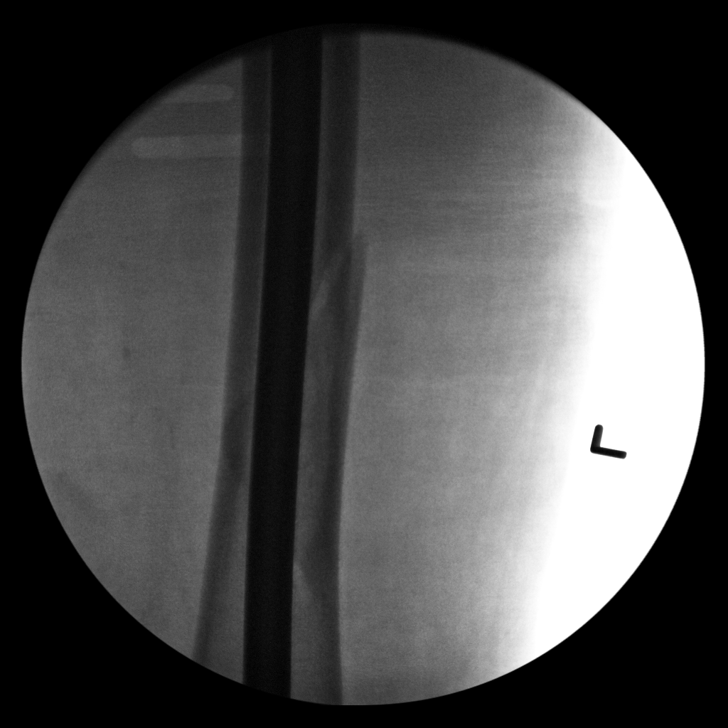
[im 2/8]
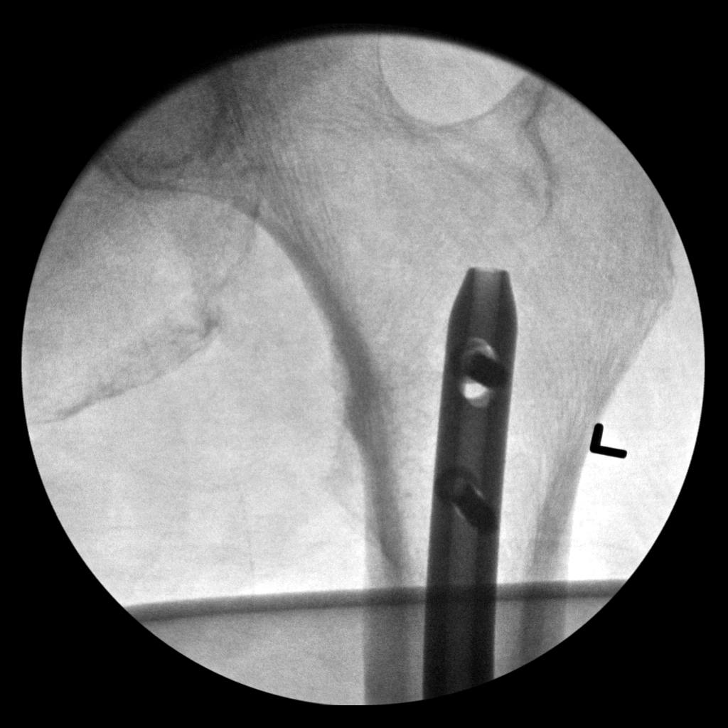
[im 3/8]
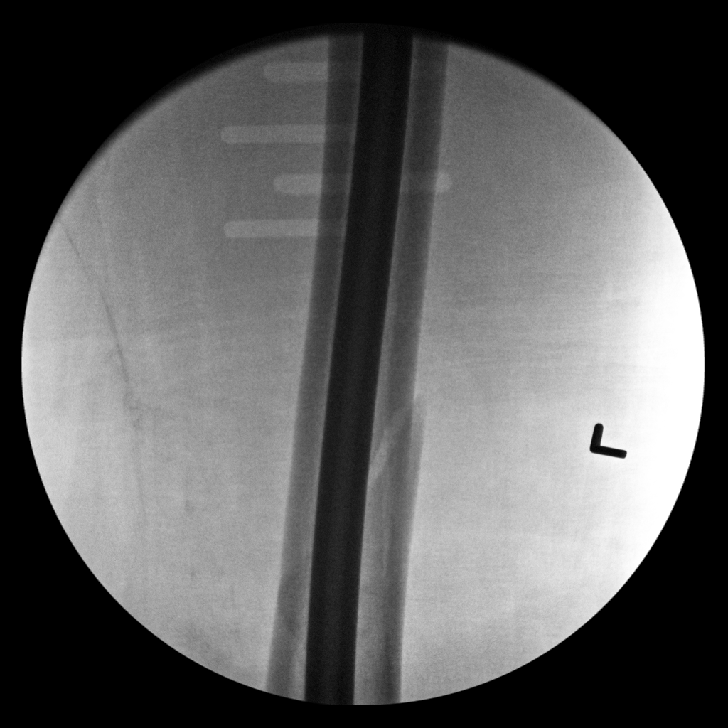
[im 4/8]
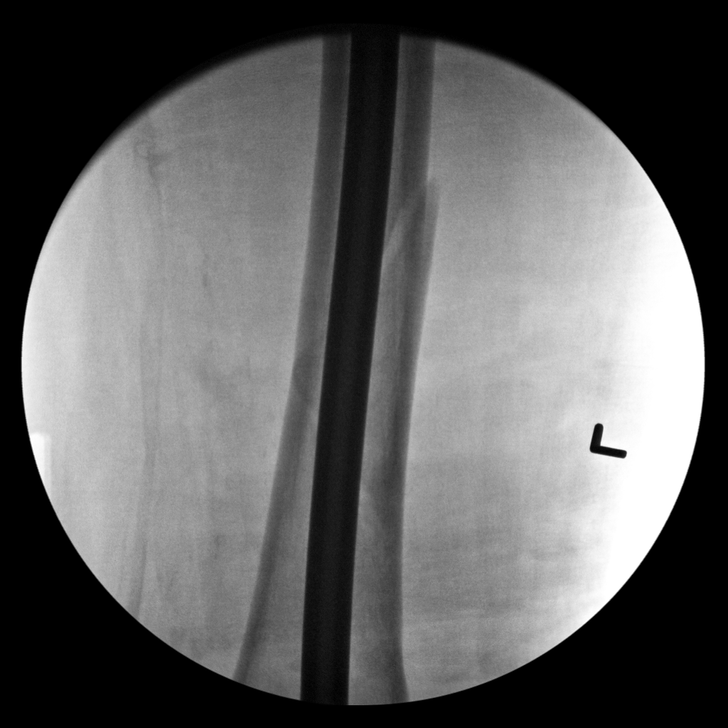
[im 5/8]
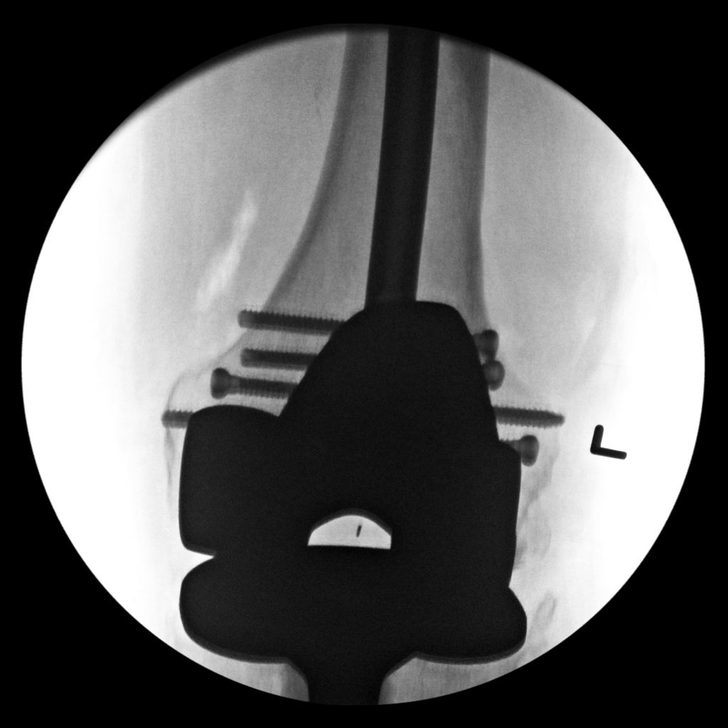
[im 6/8]
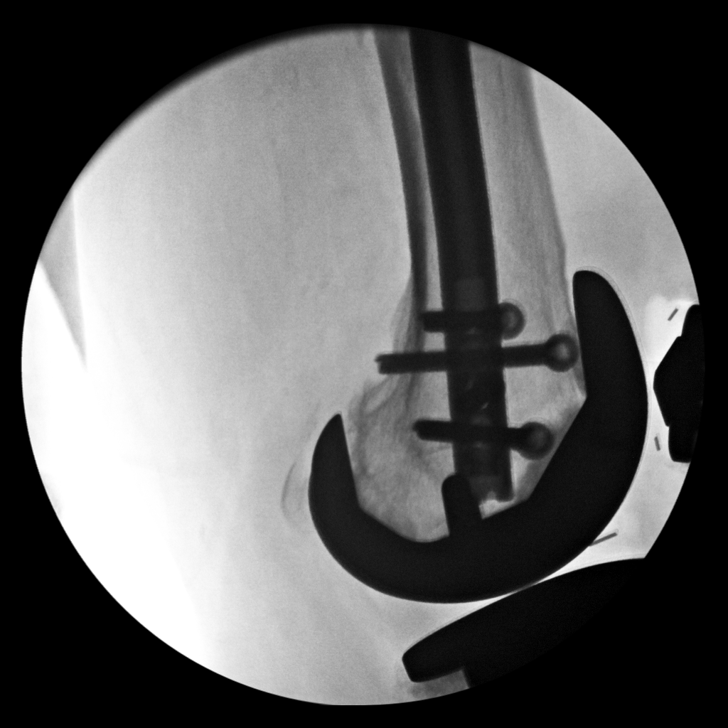
[im 7/8]
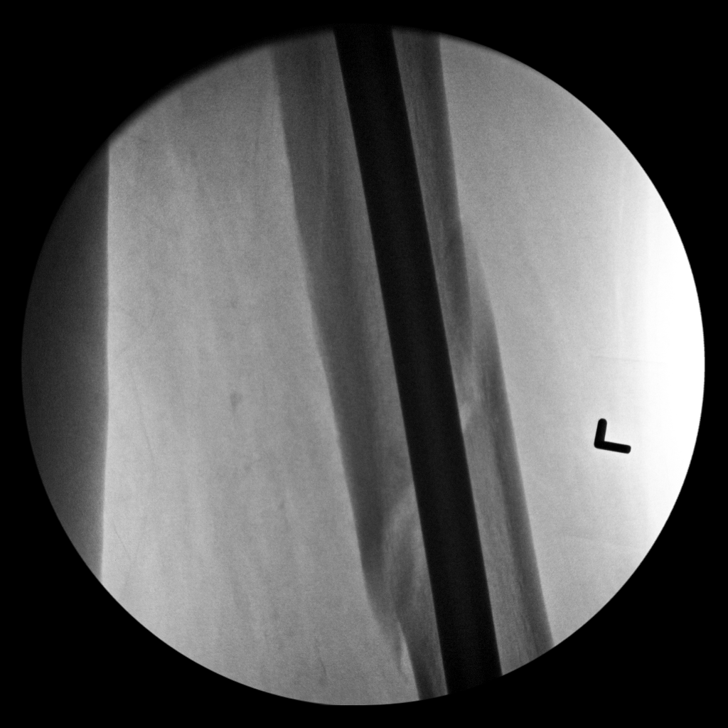
[im 8/8]
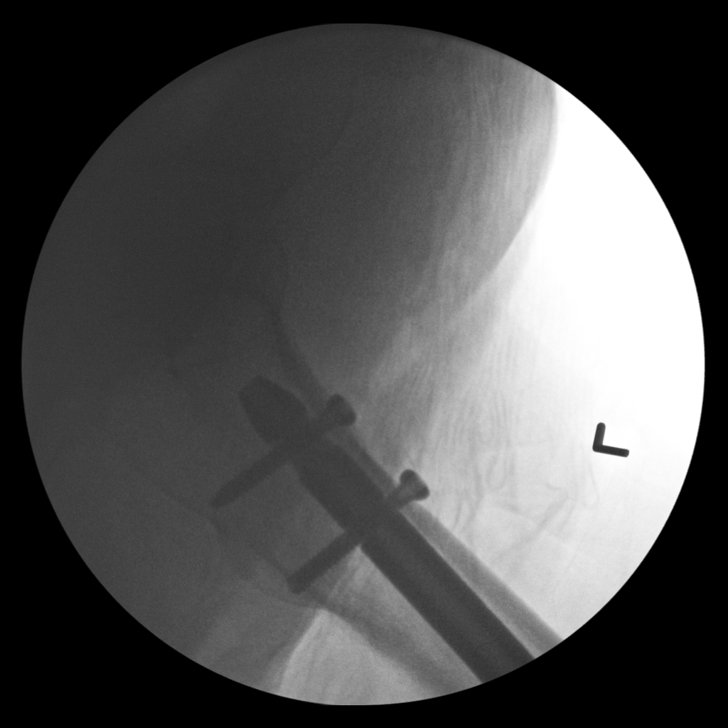

[8 of 8 positions shown; findings below may reference images not displayed]

FINDINGS: Multiple C-arm images demonstrate the patient undergoing insertion
of intramedullary nail in the left femur with proximal and distal
fixation screws. The hardware appears in excellent position. Marked
improvement in alignment and position of the fracture fragments.

Left total knee prosthesis noted.
IMPRESSION: Open reduction and internal fixation of distal left femoral shaft
fracture. Near anatomic alignment and position.

FLUOROSCOPY TIME:  2 minutes 54 seconds

C-arm fluoroscopic images were obtained intraoperatively and
submitted for post operative interpretation.

## 2020-08-20 IMAGING — MR MRI HEAD WITHOUT AND WITH CONTRAST
14 of 16 series · 40 of 48 positions shown · IV contrast (gadavist)
Comparison: None.

CLINICAL DATA: Postoperative hypotension and dizziness.

EXAM:
MRI HEAD WITHOUT AND WITH CONTRAST
TECHNIQUE: Multiplanar, multiecho pulse sequences of the brain and surrounding
structures were obtained without and with intravenous contrast.
CONTRAST:  10 cc Gadavist

[Series 5: DWI · axial · 3.0mm · 0.88mm/px · z∈[-146,+13]mm · 6 of 108 slices shown (1 of 4)]
[im 1/108]
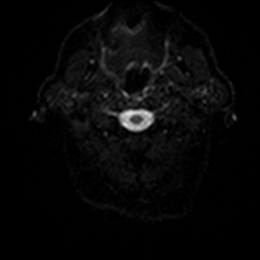
[im 22/108]
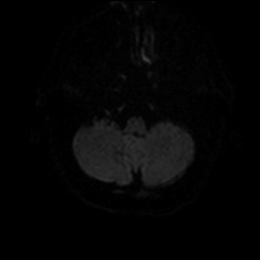
[im 43/108]
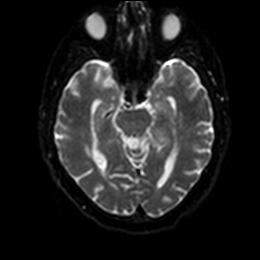
[im 65/108]
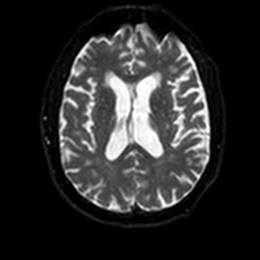
[im 86/108]
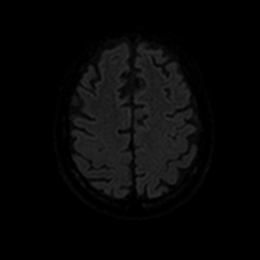
[im 108/108]
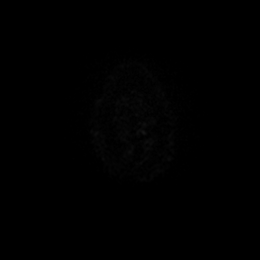

[Series 6: DWI · axial · 3.0mm · 0.88mm/px · z∈[-146,+13]mm · 2 of 54 slices shown (2 of 4)]
[im 1/54]
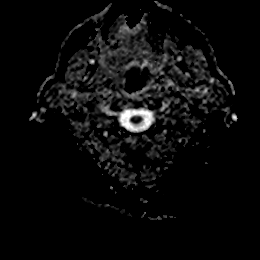
[im 54/54]
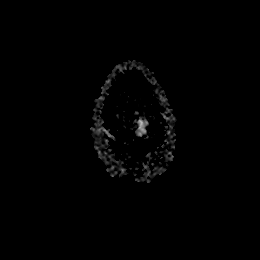

[Series 7: DWI · coronal · 4.0mm · 0.88mm/px · 5 of 78 slices shown (3 of 4)]
[im 1/78]
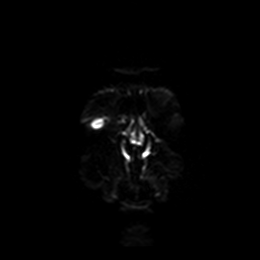
[im 20/78]
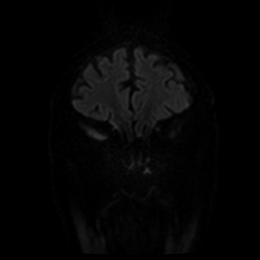
[im 39/78]
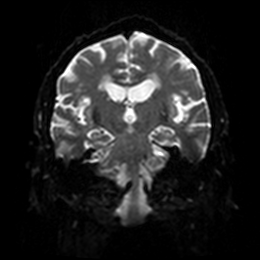
[im 58/78]
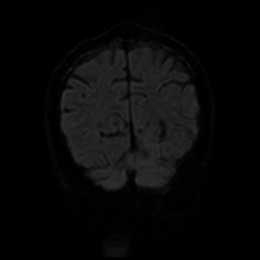
[im 78/78]
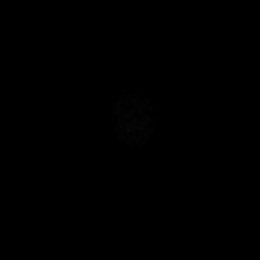

[Series 8: DWI · coronal · 4.0mm · 0.88mm/px · 2 of 39 slices shown (4 of 4)]
[im 1/39]
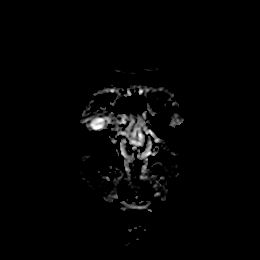
[im 39/39]
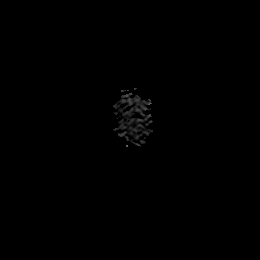

[Series 9: T1 · sagittal · 5.0mm · 0.75mm/px · 2 of 27 slices shown]
[im 1/27]
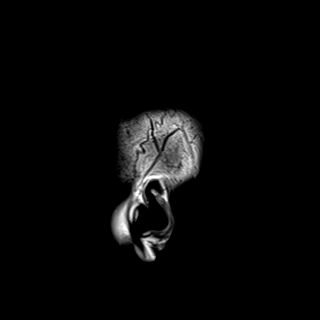
[im 27/27]
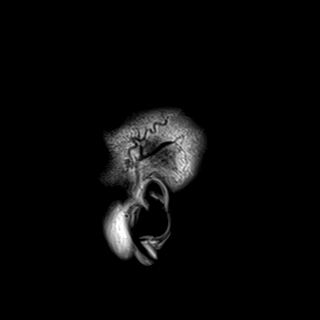

[Series 10: T2 · axial · 5.0mm · 0.72mm/px · z∈[-153,+20]mm · 2 of 30 slices shown (1 of 2)]
[im 1/30]
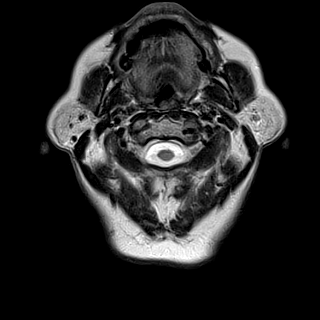
[im 30/30]
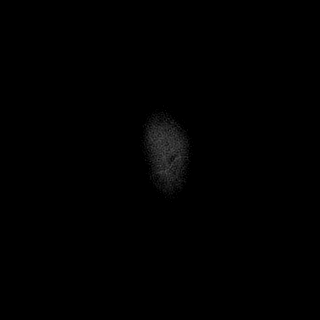

[Series 11: FLAIR · axial · 5.0mm · 0.45mm/px · z∈[-153,+20]mm · 2 of 30 slices shown]
[im 1/30]
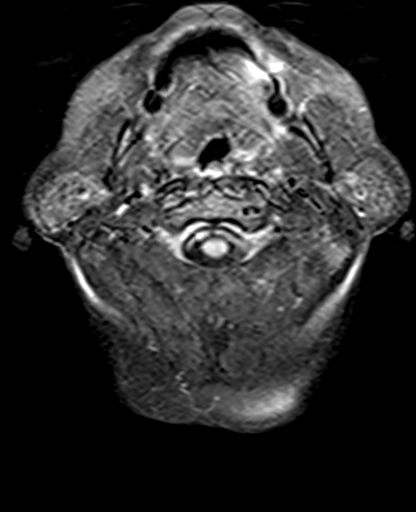
[im 30/30]
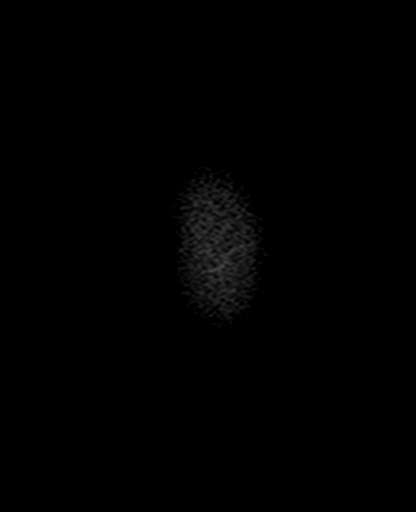

[Series 12: mag_images · axial · 3.0mm · 0.90mm/px · z∈[-155,+22]mm · 4 of 60 slices shown]
[im 1/60]
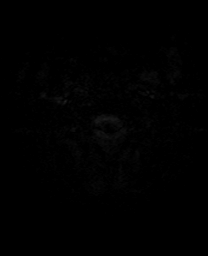
[im 20/60]
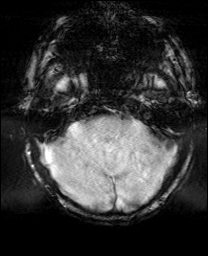
[im 40/60]
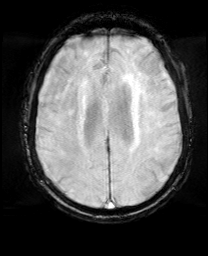
[im 60/60]
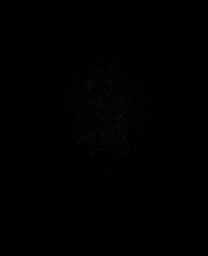

[Series 13: pha_images · axial · 3.0mm · 0.90mm/px · z∈[-155,+16]mm · 3 of 57 slices shown]
[im 1/57]
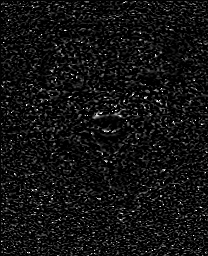
[im 29/57]
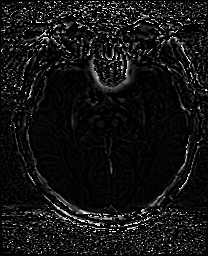
[im 57/57]
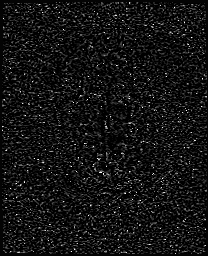

[Series 14: swi_images · axial · 3.0mm · 0.90mm/px · z∈[-155,+22]mm · 4 of 60 slices shown]
[im 1/60]
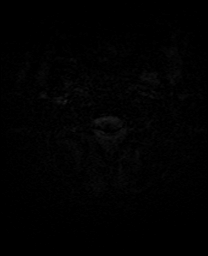
[im 20/60]
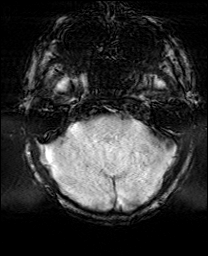
[im 40/60]
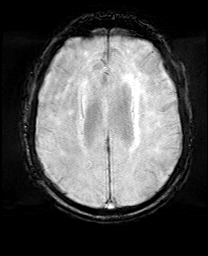
[im 60/60]
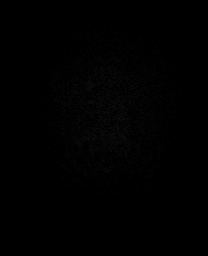

[Series 15: mip_images(sw) · axial · 24.0mm · 0.90mm/px · z∈[-144,+11]mm · 3 of 53 slices shown]
[im 1/53]
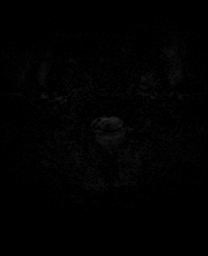
[im 27/53]
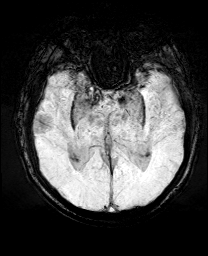
[im 53/53]
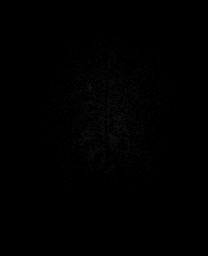

[Series 17: T2 · coronal · 5.0mm · 0.34mm/px · 2 of 29 slices shown (2 of 2)]
[im 1/29]
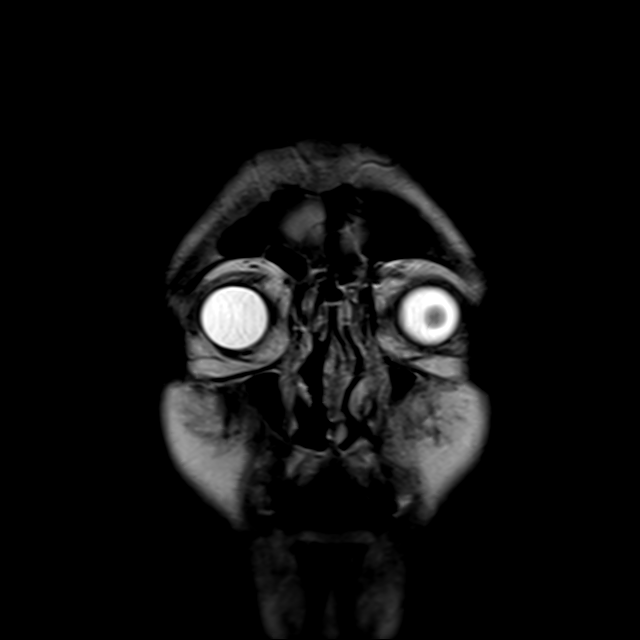
[im 29/29]
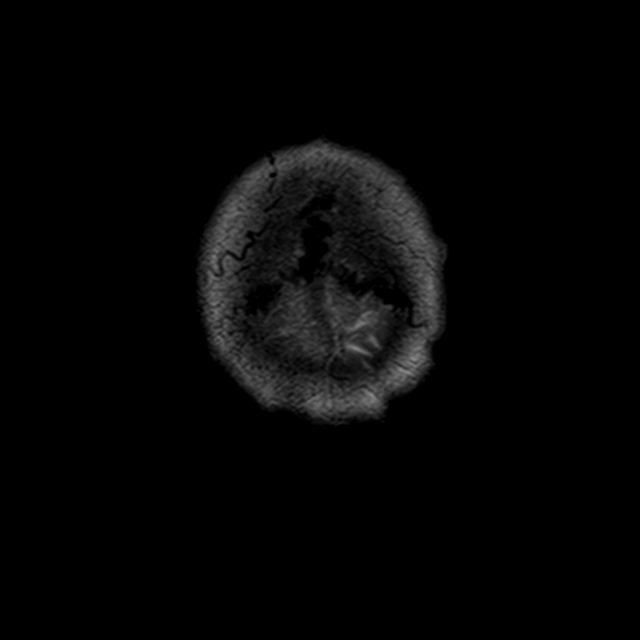

[Series 19: T1 post-contrast · coronal · 5.0mm · 0.34mm/px · 2 of 28 slices shown (1 of 2)]
[im 1/28]
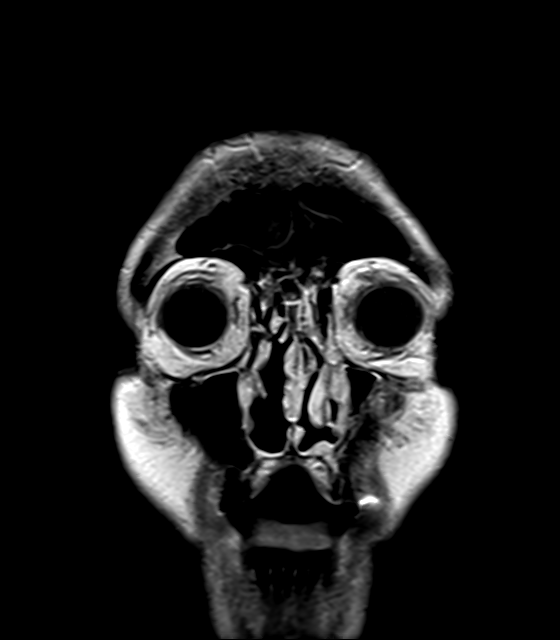
[im 28/28]
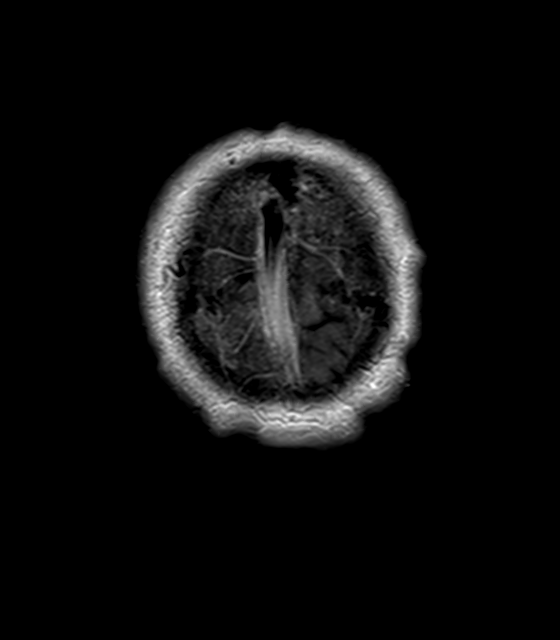

[Series 20: T1 post-contrast · sagittal · 5.0mm · 0.72mm/px · 1 of 23 slices shown (2 of 2)]
[im 1/23]
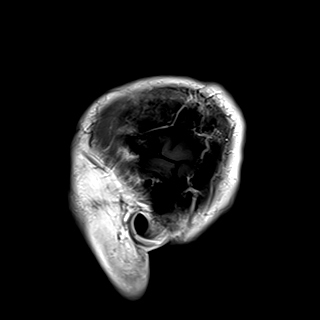

[40 of 48 positions shown; findings below may reference images not displayed]

FINDINGS: Brain: Diffusion imaging does not show any acute or subacute
infarction. There is mild generalized age related atrophy. No focal
abnormality is seen affecting the brainstem or cerebellum. Cerebral
hemispheres show moderate chronic small-vessel ischemic changes of
the white matter. No cortical or large vessel territory infarction.
No mass lesion, hemorrhage, hydrocephalus or extra-axial collection.

Vascular: Major vessels at the base of the brain show flow.

Skull and upper cervical spine: Negative

Sinuses/Orbits: Clear/normal

Other: None
IMPRESSION: No acute or specific finding by MRI. Age related atrophy. Moderate
chronic small-vessel ischemic changes of the cerebral hemispheric
white matter.

## 2021-07-23 ENCOUNTER — Other Ambulatory Visit (HOSPITAL_BASED_OUTPATIENT_CLINIC_OR_DEPARTMENT_OTHER): Payer: Self-pay

## 2021-07-23 MED ORDER — INFLUENZA VAC A&B SA ADJ QUAD 0.5 ML IM PRSY
PREFILLED_SYRINGE | INTRAMUSCULAR | 0 refills | Status: AC
  Filled 2021-07-23: qty 0.5, 1d supply, fill #0

## 2021-08-04 ENCOUNTER — Other Ambulatory Visit (HOSPITAL_BASED_OUTPATIENT_CLINIC_OR_DEPARTMENT_OTHER): Payer: Self-pay

## 2021-08-04 ENCOUNTER — Ambulatory Visit: Payer: Medicare PPO | Attending: Internal Medicine

## 2021-08-04 DIAGNOSIS — Z23 Encounter for immunization: Secondary | ICD-10-CM

## 2021-08-04 MED ORDER — MODERNA COVID-19 BIVAL BOOSTER 50 MCG/0.5ML IM SUSP
INTRAMUSCULAR | 0 refills | Status: AC
  Filled 2021-08-04: qty 0.5, 1d supply, fill #0

## 2021-08-05 NOTE — Progress Notes (Signed)
   [facility]  Name:  Mark Pham    MRN: [id number] DOB: [birth year]  08/05/2021  [name] was observed post Covid-19 immunization for 15 minutes without incident. He was provided with Vaccine Information Sheet and instruction to access the V-Safe system.   [name] was instructed to call 911 with any severe reactions post vaccine: Difficulty breathing  Swelling of face and throat  A fast heartbeat  A bad rash all over body  Dizziness and weakness   Immunizations Administered     Name Date Dose VIS Date Route   Moderna Covid-19 vaccine Bivalent Booster 08/04/2021 12:26 PM 0.5 mL 04/12/2021 Intramuscular   Manufacturer: Gala Murdoch   Lot: [id number]   NDC: 70017-494-49

## 2021-08-11 ENCOUNTER — Other Ambulatory Visit (HOSPITAL_COMMUNITY): Payer: Self-pay

## 2021-10-08 ENCOUNTER — Other Ambulatory Visit (HOSPITAL_COMMUNITY): Payer: Self-pay

## 2021-10-29 DEATH — deceased

## 2022-05-01 ENCOUNTER — Other Ambulatory Visit (HOSPITAL_COMMUNITY): Payer: Self-pay

## 2022-05-06 ENCOUNTER — Other Ambulatory Visit (HOSPITAL_COMMUNITY): Payer: Self-pay
